# Patient Record
Sex: Male | Born: 1990 | Race: White | Hispanic: No | Marital: Married | Smoking: Never smoker
Health system: Southern US, Community
[De-identification: ages and names within clinical notes are randomized; demographics above are authoritative.]

## PROBLEM LIST (undated history)

## (undated) DIAGNOSIS — Q8500 Neurofibromatosis, unspecified: Secondary | ICD-10-CM

## (undated) DIAGNOSIS — K8050 Calculus of bile duct without cholangitis or cholecystitis without obstruction: Secondary | ICD-10-CM

## (undated) HISTORY — PX: HERNIA REPAIR: SHX51

---
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
## 2010-05-05 ENCOUNTER — Emergency Department: Payer: Self-pay | Admitting: Unknown Physician Specialty

## 2011-09-16 IMAGING — CT CT ABD-PELV W/O CM
1 of 2 series · 15 of 32 positions shown, 19 images · non-contrast
Comparison: none

REASON FOR EXAM: (1) right flank pain; (2) right abd flank pain
COMMENTS:   May transport without cardiac monitor

PROCEDURE:     CT  - CT ABDOMEN AND PELVIS W[DATE]  [DATE]
RESULT:     Comparison: None
TECHNIQUE: Multiple axial images from the lung bases to the symphysis pubis
were obtained without oral and without intravenous contrast.

[Series 2: stone · axial · 0.59mm/px · z∈[-1562,-1188]mm · 15 of 137 slices shown, 19 images]
[im 6/137  soft-tissue]
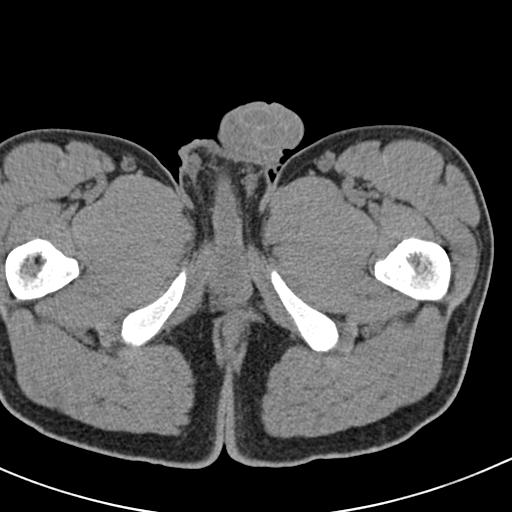
[im 6/137  bone]
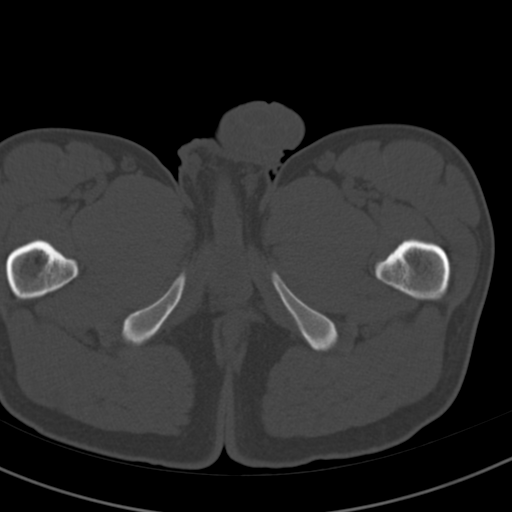
[im 16/137  soft-tissue]
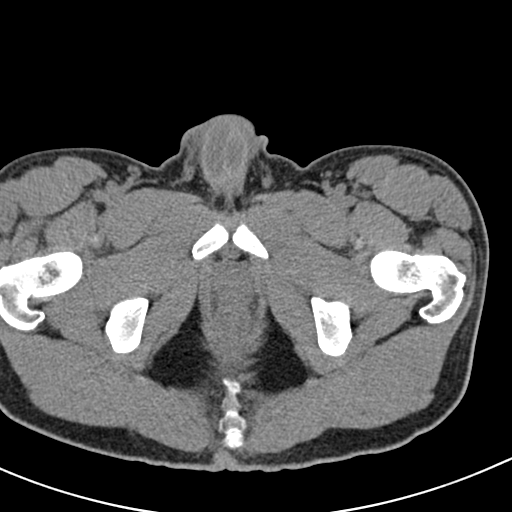
[im 27/137  soft-tissue]
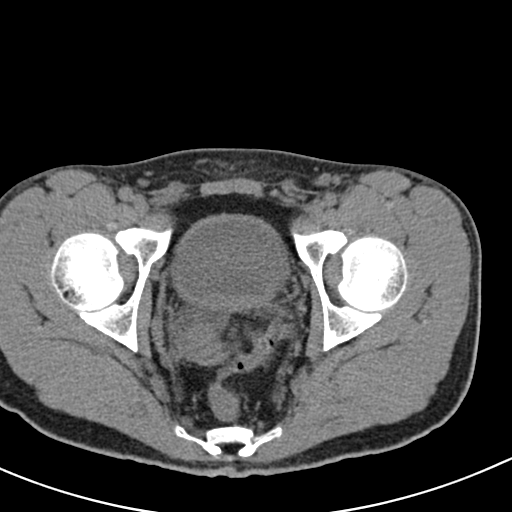
[im 37/137  soft-tissue]
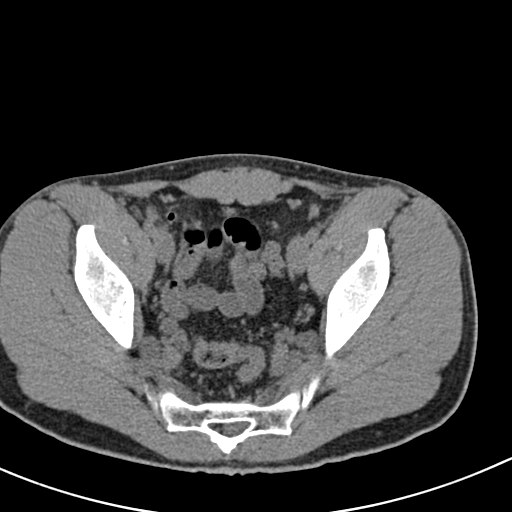
[im 48/137  soft-tissue]
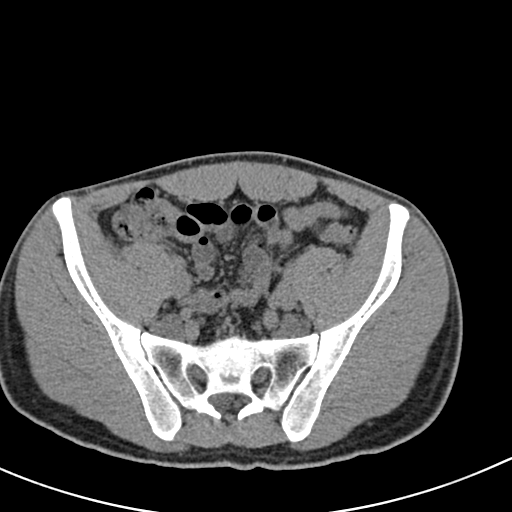
[im 58/137  soft-tissue]
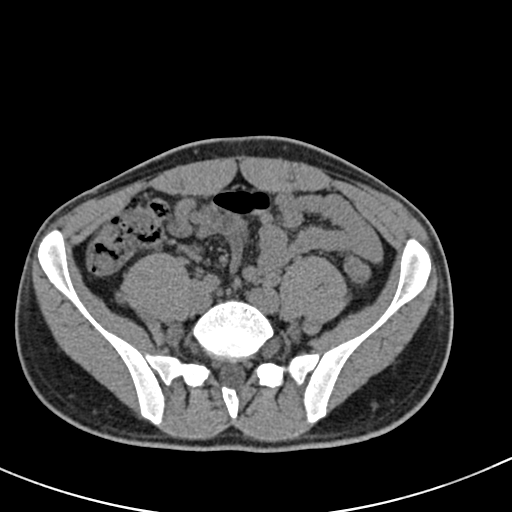
[im 69/137  soft-tissue]
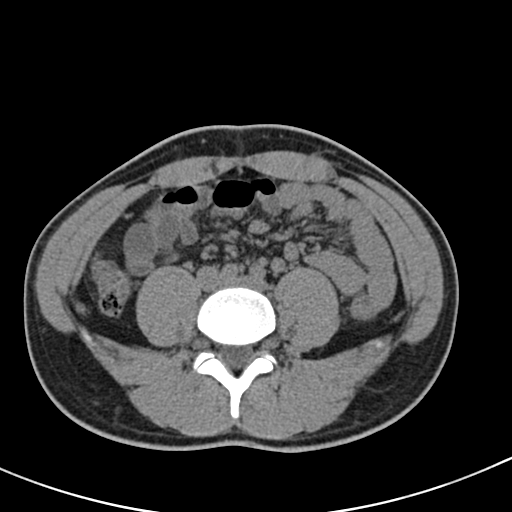
[im 79/137  soft-tissue]
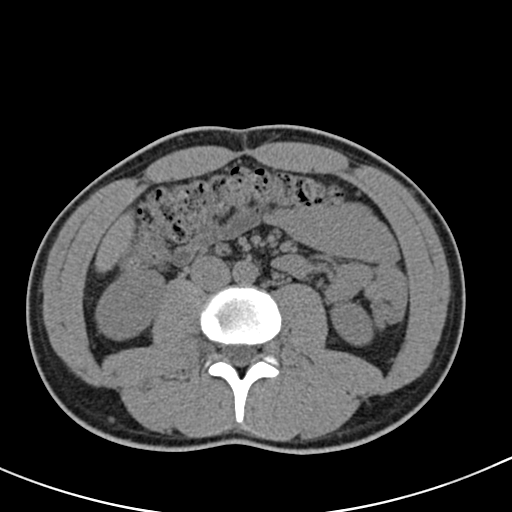
[im 89/137  soft-tissue]
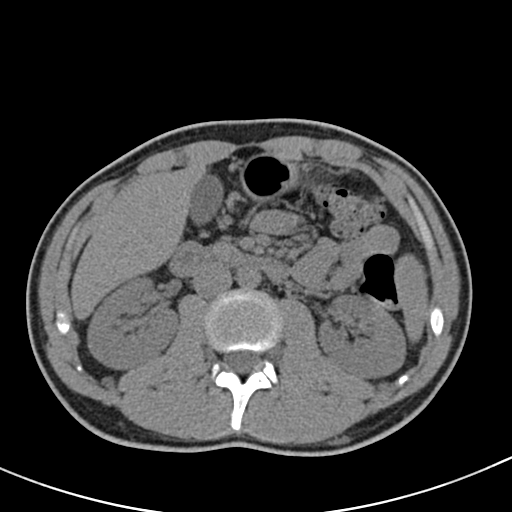
[im 89/137  bone]
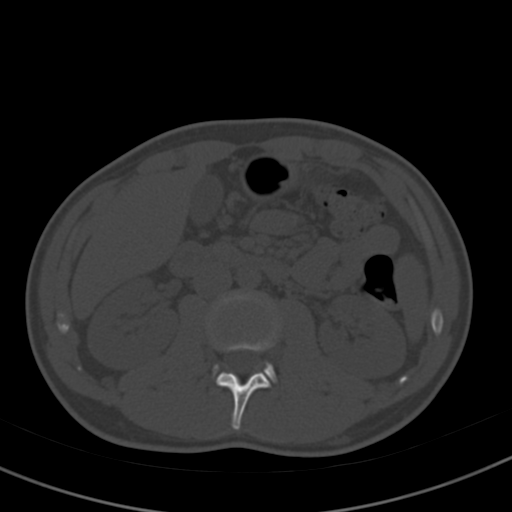
[im 100/137  soft-tissue]
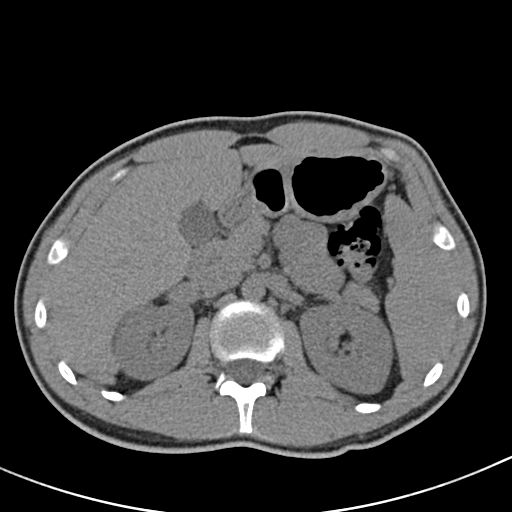
[im 110/137  soft-tissue]
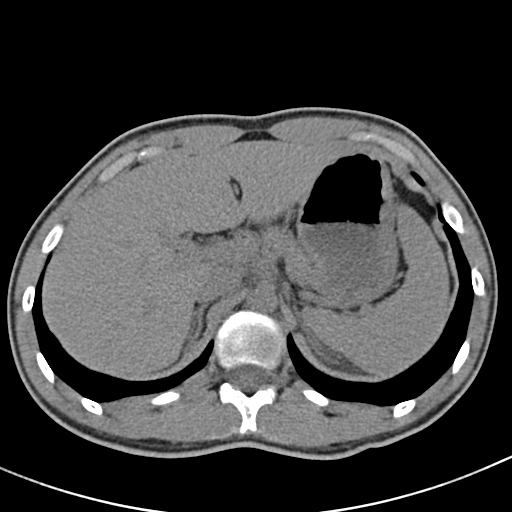
[im 116/137  lung]
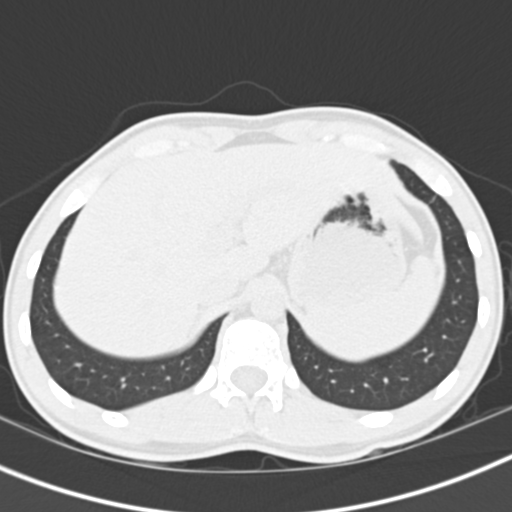
[im 121/137  soft-tissue]
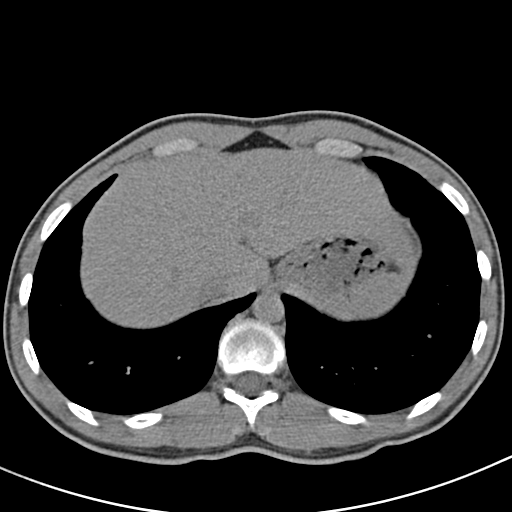
[im 121/137  lung]
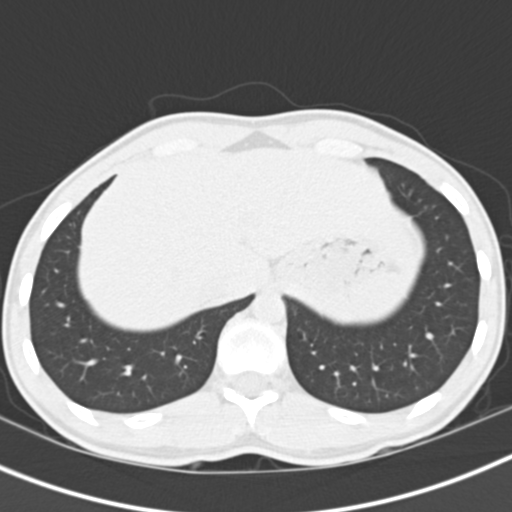
[im 126/137  lung]
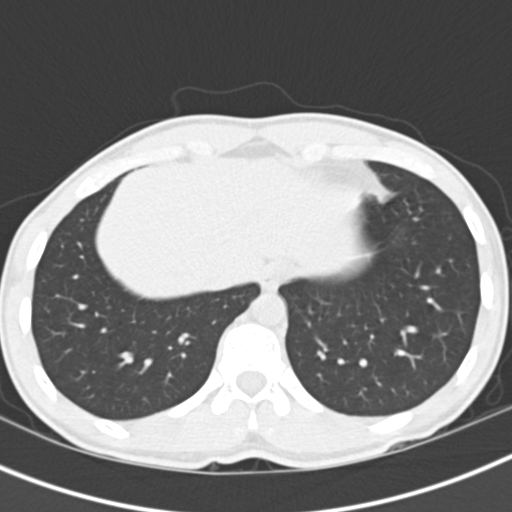
[im 131/137  soft-tissue]
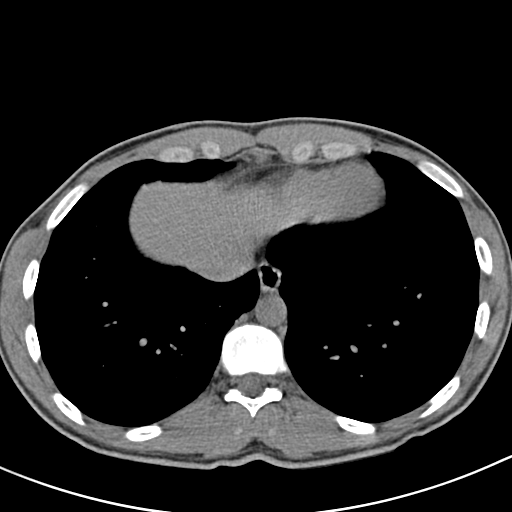
[im 131/137  lung]
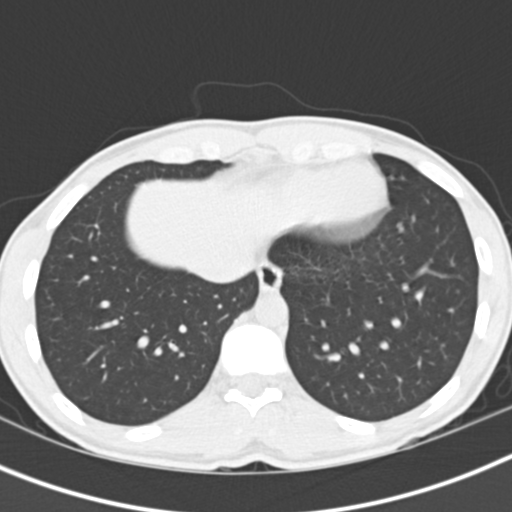

[15 of 32 positions shown; findings below may reference images not displayed]

FINDINGS: Lack of intravenous contrast limits evaluation of the solid abdominal
organs.  Grossly, the liver, gallbladder, spleen, adrenals, and pancreas are
unremarkable.

No hydronephrosis. No renal calculi identified. No perinephric stranding. No
ureterectasis.

The appendix is normal in size and air filled. There are mildly prominent
curvilinear densities in the region of the appendix which may represent
prominent vascularity. No free fluid in the pelvis.

No aggressive lytic or sclerotic osseous lesions identified.
IMPRESSION: 1. No urinary calculi identified. No hydronephrosis.
2. The appendix is normal in size and contains air. However, there are
mildly prominent linear densities in the anterior pelvis in the region of
the appendix. This is felt to be related to mildly prominent vessels in the
pelvis. However, correlate clinically for early changes of appendicitis.

## 2014-11-07 ENCOUNTER — Emergency Department
Admission: EM | Admit: 2014-11-07 | Discharge: 2014-11-07 | Disposition: A | Discharge status: Home / Self Care | Payer: Managed Care, Other (non HMO) | Attending: Student | Admitting: Student

## 2014-11-07 ENCOUNTER — Encounter: Payer: Self-pay | Admitting: Emergency Medicine

## 2014-11-07 DIAGNOSIS — L237 Allergic contact dermatitis due to plants, except food: Secondary | ICD-10-CM

## 2014-11-07 DIAGNOSIS — R21 Rash and other nonspecific skin eruption: Secondary | ICD-10-CM | POA: Diagnosis present

## 2014-11-07 HISTORY — DX: Neurofibromatosis, unspecified: Q85.00

## 2014-11-07 MED ORDER — HYDROXYZINE HCL 25 MG PO TABS
25.0000 mg | ORAL_TABLET | Freq: Once | ORAL | Status: AC
  Administered 2014-11-07: 25 mg via ORAL
  Filled 2014-11-07: qty 1

## 2014-11-07 MED ORDER — PREDNISONE 5 MG PO TABS: ORAL_TABLET | ORAL | Status: DC

## 2014-11-07 MED ORDER — METHYLPREDNISOLONE SODIUM SUCC 125 MG IJ SOLR
125.0000 mg | Freq: Once | INTRAMUSCULAR | Status: AC
  Administered 2014-11-07: 125 mg via INTRAMUSCULAR
  Filled 2014-11-07: qty 2

## 2014-11-07 MED ORDER — RANITIDINE HCL 150 MG PO TABS: 150.0000 mg | ORAL_TABLET | Freq: Two times a day (BID) | ORAL | Status: DC

## 2014-11-07 MED ORDER — FAMOTIDINE 20 MG PO TABS
20.0000 mg | ORAL_TABLET | Freq: Once | ORAL | Status: AC
  Administered 2014-11-07: 20 mg via ORAL
  Filled 2014-11-07: qty 1

## 2014-11-07 MED ORDER — HYDROXYZINE PAMOATE 25 MG PO CAPS: 25.0000 mg | ORAL_CAPSULE | Freq: Three times a day (TID) | ORAL | Status: DC | PRN

## 2014-11-07 NOTE — Discharge Instructions (Signed)

## 2014-11-07 NOTE — ED Notes (Signed)
Pt reports poison ivy rash to bilateral arms and hands, reports redness and swelling today. Pt states "it normally doesn't swell".

## 2014-11-07 NOTE — ED Provider Notes (Signed)
[facility] Emergency Department Provider Note  ____________________________________________  Time seen: Approximately 8:27 PM  I have reviewed the triage vital signs and the nursing notes.   HISTORY  Chief Complaint Rash    HPI Ronald Mueller is a 24 y.o. male has come in contact with poison ivy. Complains of bilateral arms and hands itching swelling and redness. States he probably came in contact with this at work. Denies any other complaints at this time.   Past Medical History  Diagnosis Date  . H/O neurofibromatosis     There are no active problems to display for this patient.   History reviewed. No pertinent past surgical history.  Current Outpatient Rx  Name  Route  Sig  Dispense  Refill  . hydrOXYzine (VISTARIL) 25 MG capsule   Oral   Take 1 capsule (25 mg total) by mouth 3 (three) times daily as needed.   30 capsule   0   . predniSONE (DELTASONE) 5 MG tablet      Take 6 tabs BID x 2 days, then 5 tabs BID x 2 days, then 4 tabs BIS x 2 days, then 3 tabs BID x 2 days, then 2 tabs BID x 2 days, then 1 tab BID x 2 days, then 1 tablet daily x 2 days.   86 tablet   0   . ranitidine (ZANTAC) 150 MG tablet   Oral   Take 1 tablet (150 mg total) by mouth 2 (two) times daily.   14 tablet   1     Allergies Review of patient's allergies indicates no known allergies.  No family history on file.  Social History Social History  Substance Use Topics  . Smoking status: Never Smoker   . Smokeless tobacco: None  . Alcohol Use: No    Review of Systems Constitutional: No fever/chills Eyes: No visual changes. ENT: No sore throat. Cardiovascular: Denies chest pain. Respiratory: Denies shortness of breath. Gastrointestinal: No abdominal pain.  No nausea, no vomiting.  No diarrhea.  No constipation. Genitourinary: Negative for dysuria. Musculoskeletal: Negative for back pain. Skin: Positive for skin rash Neurological: Negative for  headaches, focal weakness or numbness.  10-point ROS otherwise negative.  ____________________________________________   PHYSICAL EXAM:  VITAL SIGNS: ED Triage Vitals  Enc Vitals Group     BP 11/07/14 1753 126/75 mmHg     Pulse Rate 11/07/14 1753 69     Resp 11/07/14 1753 16     Temp 11/07/14 1753 98.4 F (36.9 C)     Temp Source 11/07/14 1753 Oral     SpO2 11/07/14 1753 97 %     Weight 11/07/14 1753 145 lb (65.772 kg)     Height 11/07/14 1753  (1.575 m)     Head Cir --      Peak Flow --      Pain Score 11/07/14 1754 0     Pain Loc --      Pain Edu? --      Excl. in GC? --     Constitutional: Alert and oriented. Well appearing and in no acute distress. Eyes: Conjunctivae are normal. PERRL. EOMI. Head: Atraumatic. Nose: No congestion/rhinnorhea. Mouth/Throat: Mucous membranes are moist.  Oropharynx non-erythematous. Neck: No stridor.   Cardiovascular: Normal rate, regular rhythm. Grossly normal heart sounds.  Good peripheral circulation. Respiratory: Normal respiratory effort.  No retractions. Lungs CTAB. Musculoskeletal: No lower extremity tenderness nor edema.  No joint effusions. Neurologic:  Normal speech and language. No gross  focal neurologic deficits are appreciated. No gait instability. Skin:  Skin is warm, dry and intact. Vesicular rash and erythema noted on bilateral upper extremities face and forehead. Psychiatric: Mood and affect are normal. Speech and behavior are normal.  ____________________________________________   LABS (all labs ordered are listed, but only abnormal results are displayed)  Labs Reviewed - No data to display ____________________________________________   PROCEDURES  Procedure(s) performed: None  Critical Care performed: No  ____________________________________________   INITIAL IMPRESSION / ASSESSMENT AND PLAN / ED COURSE  Pertinent labs & imaging results that were available during my care of the patient were reviewed  by me and considered in my medical decision making (see chart for details).  Contact dermatitis more specifically poison ivy. Rx given for prednisone taper Vistaril and Zantac for itching. Patient follow-up with PCP or return to the ER as needed. He voices no other emergency medical complaints at this time ____________________________________________   FINAL CLINICAL IMPRESSION(S) / ED DIAGNOSES  Final diagnoses:  Poison ivy dermatitis      Evangeline Dakin, PA-C 11/07/14 2029  Gayla Doss, MD 11/10/14 1401

## 2021-09-11 ENCOUNTER — Emergency Department
Admission: EM | Admit: 2021-09-11 | Discharge: 2021-09-11 | Disposition: A | Discharge status: Home / Self Care | Payer: Worker's Compensation | Attending: Emergency Medicine | Admitting: Emergency Medicine

## 2021-09-11 ENCOUNTER — Other Ambulatory Visit: Payer: Self-pay

## 2021-09-11 ENCOUNTER — Encounter: Payer: Self-pay | Admitting: Intensive Care

## 2021-09-11 DIAGNOSIS — W293XXA Contact with powered garden and outdoor hand tools and machinery, initial encounter: Secondary | ICD-10-CM | POA: Insufficient documentation

## 2021-09-11 DIAGNOSIS — S71112A Laceration without foreign body, left thigh, initial encounter: Secondary | ICD-10-CM | POA: Diagnosis not present

## 2021-09-11 DIAGNOSIS — Y990 Civilian activity done for income or pay: Secondary | ICD-10-CM | POA: Insufficient documentation

## 2021-09-11 DIAGNOSIS — S79922A Unspecified injury of left thigh, initial encounter: Secondary | ICD-10-CM | POA: Diagnosis present

## 2021-09-11 DIAGNOSIS — S81812A Laceration without foreign body, left lower leg, initial encounter: Secondary | ICD-10-CM

## 2021-09-11 MED ORDER — LIDOCAINE-EPINEPHRINE 2 %-1:100000 IJ SOLN
10.0000 mL | Freq: Once | INTRAMUSCULAR | Status: AC
  Administered 2021-09-11: 10 mL via INTRADERMAL

## 2021-09-11 NOTE — Discharge Instructions (Signed)
You were evaluated in the emergency department for a laceration. It was repaired with sutures. Keep the area clean and dry.  Wash multiple times per day with soap and water.  Do not go into the ocean or swimming pool.  Return to the emergency department or your primary care physician's office in 7-10 days for suture removal.  Return to the emergency department for:  -- Fever > 100.4F -- Increase pain in the wound -- Increase redness and swelling -- Pus coming from the wound -- Wound bleeds more than a small amount or it does not stop -- Wound edges come apart -- Severe pain -- Weakness or numbness in the affected area  Or any other new or worsening symptoms. It was a pleasure caring for you.  

## 2021-09-11 NOTE — ED Triage Notes (Signed)
Patient presents with left leg laceration. Incident at work.   [employer]  Employer requests urine test. Ronald Mueller [phone]

## 2021-09-11 NOTE — ED Provider Notes (Signed)
[facility] Provider Note    Event Date/Time   First MD Initiated Contact with Patient 09/11/21 1225     (approximate)   History   Extremity Laceration (Left leg)   HPI  Ronald Mueller is a 31 y.o. male who presents today for evaluation of leg laceration.  Patient reports that he was working with a chainsaw when he slipped and accidentally cut his leg.  He denies numbness or tingling.  He is able to ambulate.  He reports that his tetanus is up-to-date.  Denies any other injury.     Physical Exam   Triage Vital Signs: ED Triage Vitals  Enc Vitals Group     BP 09/11/21 1125 124/68     Pulse Rate 09/11/21 1125 95     Resp 09/11/21 1125 18     Temp 09/11/21 1125 98.5 F (36.9 C)     Temp Source 09/11/21 1125 Oral     SpO2 09/11/21 1125 96 %     Weight 09/11/21 1117 160 lb (72.6 kg)     Height 09/11/21 1117 5\' 2"  (1.575 m)     Head Circumference --      Peak Flow --      Pain Score 09/11/21 1117 3     Pain Loc --      Pain Edu? --      Excl. in GC? --     Most recent vital signs: Vitals:   09/11/21 1125  BP: 124/68  Pulse: 95  Resp: 18  Temp: 98.5 F (36.9 C)  SpO2: 96%    Physical Exam Vitals and nursing note reviewed.  Constitutional:      General: Awake and alert. No acute distress.    Appearance: Normal appearance. He is well-developed and normal weight.  HENT:     Head: Normocephalic and atraumatic.     Mouth/Throat:     Mouth: Mucous membranes are moist.  Eyes:     General: PERRL. Normal EOMs        Right eye: No discharge.        Left eye: No discharge.     Conjunctiva/sclera: Conjunctivae normal.  Cardiovascular:     Rate and Rhythm: Normal rate and regular rhythm.     Pulses: Normal pulses.  Pulmonary:     Effort: Pulmonary effort is normal. No respiratory distress.  Abdominal:     Abdomen is soft. There is no abdominal tenderness. No rebound or guarding. No distention. Musculoskeletal:        General: No  swelling. Normal range of motion.     Cervical back: Normal range of motion and neck supple.  Skin:    General: Skin is warm and dry.     Capillary Refill: Capillary refill takes less than 2 seconds.     Findings: No rash.  Left lower extremity: 2.5cm laceration to lateral thigh. Approximately 2-3mm deep, base of wound visualized. Approximately 3 inches above knee, does not violate joint capsule. No FB. Normal and full ROM of knee and hip with AROM and PROM. Distal sensation intact. Normal pedal pulses. Normal capillary refill Neurological:     Mental Status: He is alert.      ED Results / Procedures / Treatments   Labs (all labs ordered are listed, but only abnormal results are displayed) Labs Reviewed - No data to display   EKG     RADIOLOGY     PROCEDURES:  Critical Care performed:   1mMarland KitchenLaceration Repair  Date/Time: 09/11/2021 4:18 PM  Performed by: Jackelyn Hoehn, PA-C Authorized by: Jackelyn Hoehn, PA-C   Consent:    Consent obtained:  Verbal   Consent given by:  Patient   Risks, benefits, and alternatives were discussed: yes     Risks discussed:  Infection, need for additional repair, nerve damage, pain, poor cosmetic result, poor wound healing, retained foreign body, tendon damage and vascular damage   Alternatives discussed:  No treatment Universal protocol:    Procedure explained and questions answered to patient or proxy's satisfaction: yes     Relevant documents present and verified: yes     Test results available: yes     Required blood products, implants, devices, and special equipment available: yes     Site/side marked: yes     Immediately prior to procedure, a time out was called: yes     Patient identity confirmed:  Verbally with patient Anesthesia:    Anesthesia method:  Local infiltration   Local anesthetic:  Lidocaine 1% WITH epi Laceration details:    Location:  Leg   Leg location:  L upper leg   Length (cm):  2.5   Depth (mm):   3 Pre-procedure details:    Preparation:  Patient was prepped and draped in usual sterile fashion Exploration:    Hemostasis achieved with:  Direct pressure   Wound exploration: wound explored through full range of motion and entire depth of wound visualized     Wound extent: no foreign bodies/material noted, no muscle damage noted, no nerve damage noted, no tendon damage noted, no underlying fracture noted and no vascular damage noted   Treatment:    Area cleansed with:  Saline   Amount of cleaning:  Extensive   Irrigation solution:  Sterile saline   Irrigation volume:  1L   Irrigation method:  Pressure wash Skin repair:    Repair method:  Sutures   Number of sutures:  4 Approximation:    Approximation:  Close Repair type:    Repair type:  Simple Post-procedure details:    Dressing:  Open (no dressing)   Procedure completion:  Tolerated well, no immediate complications    MEDICATIONS ORDERED IN ED: Medications  lidocaine-EPINEPHrine (XYLOCAINE W/EPI) 2 %-1:100000 (with pres) injection 10 mL (10 mLs Intradermal Given 09/11/21 1318)     IMPRESSION / MDM / ASSESSMENT AND PLAN / ED COURSE  I reviewed the triage vital signs and the nursing notes.   Differential diagnosis includes, but is not limited to, laceratioon.  Wound fully probed and base of wound visualized, no retained foreign body.  Wound is somewhat superficial despite mechanism of injury, several inches above the knee, does not violate the joint capsule.  Wound is hemostatic upon arrival.he has full normal range of motion of his hip and knee.  Normal distal pulses, normal sensation.  Wound was anesthetized and irrigated.  It was closed with sutures.  Patient was educated on suture care and timeline for removal.  We discussed return precautions.  Tdap already up-to-date.  Patient understands and agrees with plan.  Discharged in stable condition.   Patient's presentation is most consistent with acute, uncomplicated  illness.     FINAL CLINICAL IMPRESSION(S) / ED DIAGNOSES   Final diagnoses:  Laceration of left lower extremity, initial encounter     Rx / DC Orders   ED Discharge Orders     None        Note:  This document was prepared using Dragon voice recognition  software and may include unintentional dictation errors.   Keturah Shavers 09/11/21 1619    Jene Every, MD 09/12/21 [phone]

## 2021-09-18 DIAGNOSIS — Q8500 Neurofibromatosis, unspecified: Secondary | ICD-10-CM | POA: Insufficient documentation

## 2022-02-05 NOTE — Patient Instructions (Incomplete)

## 2022-02-08 ENCOUNTER — Ambulatory Visit: Payer: Self-pay | Admitting: Urology

## 2022-02-08 ENCOUNTER — Encounter: Payer: Self-pay | Admitting: Urology

## 2022-02-26 NOTE — Patient Instructions (Incomplete)

## 2022-02-27 ENCOUNTER — Encounter: Payer: Self-pay | Admitting: Urology

## 2022-02-27 ENCOUNTER — Ambulatory Visit (INDEPENDENT_AMBULATORY_CARE_PROVIDER_SITE_OTHER): Payer: 59 | Admitting: Urology

## 2022-02-27 VITALS — BP 114/74 | HR 76 | Ht 63.0 in | Wt 160.0 lb

## 2022-02-27 DIAGNOSIS — Z3009 Encounter for other general counseling and advice on contraception: Secondary | ICD-10-CM | POA: Diagnosis not present

## 2022-02-28 MED ORDER — DIAZEPAM 10 MG PO TABS: 10.0000 mg | ORAL_TABLET | Freq: Once | ORAL | 0 refills | Status: AC

## 2022-02-28 NOTE — Progress Notes (Signed)
02/27/2022 8:07 AM   Ronald Mueller 1990-07-04 [id number]  Referring provider: Carren Rang, PA-C [address] [place],  [place] [address]  Chief Complaint  Patient presents with   VAS Consult    HPI: 31 y.o. year old male referred for further evaluation of possible vasectomy.  He denies a history of testicular trauma or pain.  No urinary issues.  No previous scrotal surgeries.  He has 2 biological children.  He and is wife desire no further pregnancies.     PMH: Past Medical History:  Diagnosis Date   H/O neurofibromatosis [facility])     Surgical History: No past surgical history on file.  Home Medications:  Allergies as of 02/27/2022   No Known Allergies      Medication List        Accurate as of February 27, 2022 11:59 PM. If you have any questions, ask your nurse or doctor.          STOP taking these medications    hydrOXYzine 25 MG capsule Commonly known as: VISTARIL Stopped by: Vanna Scotland, MD   predniSONE 5 MG tablet Commonly known as: DELTASONE Stopped by: Vanna Scotland, MD   ranitidine 150 MG tablet Commonly known as: Zantac Stopped by: Vanna Scotland, MD        Allergies: No Known Allergies  Family History: No family history on file.  Social History:  reports that he has never smoked. He has never been exposed to tobacco smoke. He has never used smokeless tobacco. He reports that he does not drink alcohol and does not use drugs.   Physical Exam: BP 114/74   Pulse 76   Ht 5\' 3"  (1.6 m)   Wt 160 lb (72.6 kg)   BMI 28.34 kg/m   Constitutional:  Alert and oriented, No acute distress. HEENT: [place] AT, moist mucus membranes.  Trachea midline, no masses. Cardiovascular: No clubbing, cyanosis, or edema. Respiratory: Normal respiratory effort, no increased work of breathing. GI: Abdomen is soft, nontender, nondistended, no abdominal masses GU: Normal phallus.  Bilateral descended testicles without masses.   Vasa easily palpable bilaterally. Skin: No rashes, bruises or suspicious lesions. Neurologic: Grossly intact, no focal deficits, moving all 4 extremities. Psychiatric: Normal mood and affect.   Assessment & Plan:    1. Vasectomy evaluation Today, we discussed what the vas deferens is, where it is located, and its function. We reviewed the procedure for vasectomy, it's risks, benefits, alternatives, and likelihood of achieving his goals. We discussed in detail the procedure, complications, and recovery as well as the need for clearance prior to unprotected intercourse. We discussed that vasectomy does not protect against sexually transmitted diseases. We discussed that this procedure does not result in immediate sterility and that they would need to use other forms of birth control until he has been cleared with negative postvasectomy semen analyses. I explained that the procedure is considered to be permanent and that attempts at reversal have varying degrees of success. These options include vasectomy reversal, sperm retrieval, and in vitro fertilization; these can be very expensive. We discussed the chance of postvasectomy pain syndrome which occurs in less than 5% of patients. I explained to the patient that there is no treatment to resolve this chronic pain, and that if it developed I would not be able to help resolve the issue, but that surgery is generally not needed for correction. I explained there have even been reports of systemic like illness associated with this chronic pain,  and that there was no good cure. I explained that vasectomy it is not a 100% reliable form of birth control, and the risk of pregnancy after vasectomy is approximately 1 in 2000 men who had a negative postvasectomy semen analysis or rare non-motile sperm. I explained that repeat vasectomy was necessary in less than 1% of vasectomy procedures when employing the type of technique that I use. I explained that he should refrain  from ejaculation for approximately one week following vasectomy. I explained that there are other options for birth control which are permanent and non-permanent; we discussed these. I explained the rates of surgical complications, such as symptomatic hematoma or infection, are low (1-2%) and vary with the surgeon's experience and criteria used to diagnose the complication.   The patient had the opportunity to ask questions to his stated satisfaction. He voiced understanding of the above factors and stated that he has read all the information provided to him and the packets and informed consent.  He is interested in receiving of Valium 10 mg prior to the procedure for the purpose of anxiolysis.  A prescription was given today.  He will have a driver on the day of the procedure.    Vanna Scotland, MD  [facility] [address] [place], [place] [address] [phone]

## 2022-03-05 ENCOUNTER — Encounter: Payer: Self-pay | Admitting: Urology

## 2022-04-04 ENCOUNTER — Ambulatory Visit (INDEPENDENT_AMBULATORY_CARE_PROVIDER_SITE_OTHER): Payer: Self-pay | Admitting: Urology

## 2022-04-04 DIAGNOSIS — Z9852 Vasectomy status: Secondary | ICD-10-CM

## 2022-04-04 DIAGNOSIS — Z302 Encounter for sterilization: Secondary | ICD-10-CM

## 2022-04-04 NOTE — Patient Instructions (Signed)

## 2022-04-04 NOTE — Progress Notes (Signed)
04/04/22  CC:  Chief Complaint  Patient presents with   Sterilization    HPI: 32 year old male who presents today for vasectomy.  Consent was confirmed.  NED. A&Ox3.   No respiratory distress   Abd soft, NT, ND Normal external genitalia with patent urethral meatus  A timeout was performed.  Patient's identity and consent was confirmed.  All questions were answered.   Bilateral Vasectomy Procedure  Pre-Procedure: - Patient's scrotum was prepped and draped for vasectomy. - The vas was palpated through the scrotal skin on the left. - 1% Xylocaine was injected into the skin and surrounding tissue for placement  - In a similar manner, the vas on the right was identified, anesthetized, and stabilized.  Procedure: - A #11 blade was used to make a small stab incision in the skin overlying the vas - The left vas was isolated and brought up through the incision exposing that structure. - Bleeding points were cauterized as they occurred. - The vas was free from the surrounding structures and brought to the view. - A segment was positioned for placement with a hemostat. - A second hemostat was placed and a small segment between the two hemostats and was removed for inspection. - Each end of the transected vas lumen was fulgurated/ obliterated using needlepoint electrocautery -A fascial interposition was performed on testicular end of the vas using #3-0 chromic suture -The same procedure was performed on the right. - A single suture of #3-0 chromic catgut was used to close each lateral scrotal skin incision - A dressing was applied.  Post-Procedure: - Patient was instructed in care of the operative area - A specimen is to be delivered in 12 weeks   -Another form of contraception is to be used until post vasectomy semen analysis  Hollice Espy, MD

## 2022-07-02 ENCOUNTER — Encounter: Payer: Self-pay | Admitting: Urology

## 2022-07-03 NOTE — Addendum Note (Signed)
Addended by: Consuella Lose on: 07/03/2022 08:21 AM   Modules accepted: Orders

## 2022-07-04 ENCOUNTER — Other Ambulatory Visit: Payer: Medicaid Other

## 2022-07-04 DIAGNOSIS — Z9852 Vasectomy status: Secondary | ICD-10-CM

## 2022-07-05 LAB — POST-VAS SPERM EVALUATION,QUAL

## 2022-07-26 ENCOUNTER — Other Ambulatory Visit: Payer: Medicaid Other

## 2022-07-26 ENCOUNTER — Other Ambulatory Visit: Payer: Self-pay

## 2022-07-26 DIAGNOSIS — Z9852 Vasectomy status: Secondary | ICD-10-CM

## 2022-07-27 LAB — POST-VAS SPERM EVALUATION,QUAL: Volume: 1.7 mL

## 2022-08-01 ENCOUNTER — Emergency Department
Admission: EM | Admit: 2022-08-01 | Discharge: 2022-08-01 | Disposition: A | Discharge status: Home / Self Care | Payer: Medicaid Other | Attending: Emergency Medicine | Admitting: Emergency Medicine

## 2022-08-01 ENCOUNTER — Encounter: Payer: Self-pay | Admitting: Urology

## 2022-08-01 ENCOUNTER — Emergency Department: Payer: Medicaid Other

## 2022-08-01 ENCOUNTER — Telehealth: Payer: Self-pay | Admitting: Family Medicine

## 2022-08-01 ENCOUNTER — Other Ambulatory Visit: Payer: Self-pay

## 2022-08-01 DIAGNOSIS — N50812 Left testicular pain: Secondary | ICD-10-CM

## 2022-08-01 DIAGNOSIS — K4090 Unilateral inguinal hernia, without obstruction or gangrene, not specified as recurrent: Secondary | ICD-10-CM | POA: Diagnosis not present

## 2022-08-01 LAB — URINALYSIS, ROUTINE W REFLEX MICROSCOPIC
Bilirubin Urine: NEGATIVE
Glucose, UA: NEGATIVE mg/dL
Hgb urine dipstick: NEGATIVE
Ketones, ur: NEGATIVE mg/dL
Leukocytes,Ua: NEGATIVE
Nitrite: NEGATIVE
Protein, ur: NEGATIVE mg/dL
Specific Gravity, Urine: 1.026 (ref 1.005–1.030)
pH: 6 (ref 5.0–8.0)

## 2022-08-01 NOTE — ED Triage Notes (Signed)
Pt presents to ED with L sided testicle pain, pt states vesicotomy in Jan. Pt states pain started 1 hour ago. Pt in NAD noted. Pt states pain radiates to groin lower ABD area.

## 2022-08-01 NOTE — Telephone Encounter (Signed)
Patient called and states he was seen in the ER and they said he has an Inguinal hernia and testicle pain. They were wanting him to schedule an appointment with our office. Do you him to be scheduled here or contact PCP for referral for inguinal hernia first?

## 2022-08-01 NOTE — Discharge Instructions (Signed)
Your testicular ultrasound shows a normal testicle, however you appears that you have a small inguinal hernia that is fat-containing.  Please wear supportive underpants.  You may take ibuprofen per package instructions to help with your pain.  Please follow-up with urology.  Please refrain from any heavy lifting.  It was a pleasure caring for you today.

## 2022-08-01 NOTE — ED Notes (Signed)
Patient transported to Ultrasound 

## 2022-08-01 NOTE — Telephone Encounter (Signed)
Left detailed message letting patient know that he needs to contact PCP for general surgeon referral and that if he is having testicular pain/issues and he would like to see [place] for that he can set up appointment with a PA but ER did the work up for this. Sent mychart message with this information also.

## 2022-08-01 NOTE — ED Provider Notes (Signed)
[facility] Provider Note    Event Date/Time   First MD Initiated Contact with Patient 08/01/22 1152     (approximate)   History   Testicle Pain   HPI  Ronald Mueller is a 32 y.o. male presents today for evaluation of left testicular pain.  Patient reports that he noticed a tugging in his left testicle prior to arrival.  He denies abdominal pain, nausea, vomiting, diarrhea.  He reports that he is sexually active with 1 male partner only.  He has not had any burning with urination.  He has not had any penile discharge.  He has not concerned about sexually transmitted diseases.  No fevers or chills.  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      Excl. in GC?     Most recent vital signs: Vitals:   08/01/22 1135  BP: (!) 130/50  Pulse: 93  Resp: 18  Temp: 98.5 F (36.9 C)  SpO2: 99%    Physical Exam Vitals and nursing note reviewed.  Constitutional:      General: Awake and alert. No acute distress.    Appearance: Normal appearance. The patient is normal weight.  HENT:     Head: Normocephalic and atraumatic.     Mouth: Mucous membranes are moist.  Eyes:     General: PERRL. Normal EOMs        Right eye: No discharge.        Left eye: No discharge.     Conjunctiva/sclera: Conjunctivae normal.  Cardiovascular:     Rate and Rhythm: Normal rate and regular rhythm.     Pulses: Normal pulses.  Pulmonary:     Effort: Pulmonary effort is normal. No respiratory distress.     Breath sounds: Normal breath sounds.  Abdominal:     Abdomen is soft. There is no abdominal  tenderness. No rebound or guarding. No distention. GU: left testicular tenderness, though no skin changes, no swelling. Normal testicular lay, normal cremasteric reflex Musculoskeletal:        General: No swelling. Normal range of motion.     Cervical back: Normal range of motion and neck supple.  Skin:    General: Skin is warm and dry.     Capillary Refill: Capillary refill takes less than 2 seconds.     Findings: No rash.  Neurological:     Mental Status: The patient is awake and alert.      ED Results / Procedures / Treatments   Labs (all labs ordered are listed, but only abnormal results are displayed) Labs Reviewed  URINALYSIS, ROUTINE W REFLEX MICROSCOPIC - Abnormal; Notable for the following components:      Result Value   Color, Urine YELLOW (*)    APPearance HAZY (*)    All  other components within normal limits  CHLAMYDIA/NGC RT PCR [facility] ONLY)               EKG     RADIOLOGY I independently reviewed and interpreted imaging and agree with radiologists findings.     PROCEDURES:  Critical Care performed:   Procedures   MEDICATIONS ORDERED IN ED: Medications - No data to display   IMPRESSION / MDM / ASSESSMENT AND PLAN / ED COURSE  I reviewed the triage vital signs and the nursing notes.   Differential diagnosis includes, but is not limited to, epididymitis, orchitis, testicular torsion, hernia, varicocele, hydrocele.  Patient is awake and alert, hemodynamically stable and afebrile.  He has a normal-appearing testicular exam, normal testicular lie, no swelling, erythema, or skin changes.  No discharge.  Ultrasound obtained is negative for any acute findings related to the testicles, however he does have a fat-containing left inguinal hernia which may be contributing to his symptoms.  Urinalysis does not demonstrate infection.  I recommended outpatient follow-up with urology and he was also given the information for general surgery.  We discussed the  importance of scrotal support and strict return precautions.  Patient understands and agrees with plan.  He was discharged in stable condition.   Patient's presentation is most consistent with acute complicated illness / injury requiring diagnostic workup.  FINAL CLINICAL IMPRESSION(S) / ED DIAGNOSES   Final diagnoses:  Left testicular pain  Inguinal hernia, left     Rx / DC Orders   ED Discharge Orders     None        Note:  This document was prepared using Dragon voice recognition software and may include unintentional dictation errors.   Keturah Shavers 08/01/22 1328    Chesley Noon, MD 08/01/22 1925

## 2022-08-02 LAB — CHLAMYDIA/NGC RT PCR (ARMC ONLY)
Chlamydia Tr: NOT DETECTED
N gonorrhoeae: NOT DETECTED

## 2022-08-08 ENCOUNTER — Encounter: Payer: Self-pay | Admitting: Surgery

## 2022-08-08 ENCOUNTER — Ambulatory Visit: Payer: Medicaid Other | Admitting: Surgery

## 2022-08-08 VITALS — BP 131/74 | HR 75 | Temp 98.2°F | Ht 62.0 in | Wt 158.4 lb

## 2022-08-08 DIAGNOSIS — K4090 Unilateral inguinal hernia, without obstruction or gangrene, not specified as recurrent: Secondary | ICD-10-CM | POA: Diagnosis not present

## 2022-08-08 NOTE — Patient Instructions (Signed)
Our surgery scheduler Barbara will call you within 24-48 hours to get you scheduled. If you have not heard from her after 48 hours, please call our office. Have the blue sheet available when she calls to write down important information.   If you have any concerns or questions, please feel free to call our office.   Laparoscopic Inguinal Hernia Repair, Adult Laparoscopic inguinal hernia repair is a surgical procedure to repair a small, weak spot in the groin muscles that allows fat or intestines from inside the abdomen to bulge out (inguinal hernia). This procedure may be planned, or it may be an emergency procedure. During the procedure, tissue that has bulged out is moved back into place, and the opening in the groin muscles is repaired. This is done through three small incisions in the abdomen. A thin tube with a light and camera on the end (laparoscope) is used to help perform the procedure. Tell a health care provider about: Any allergies you have. All medicines you are taking, including vitamins, herbs, eye drops, creams, and over-the-counter medicines. Any problems you or family members have had with anesthetic medicines. Any blood disorders you have. Any surgeries you have had. Any medical conditions you have. Whether you are pregnant or may be pregnant. What are the risks? Generally, this is a safe procedure. However, problems may occur, including: Infection. Bleeding. Allergic reactions to medicines. Damage to nearby structures or organs. Testicle damage or long-term pain and swelling of the scrotum, in males. Inability to completely empty the bladder (urinary retention). Blood clots. A collection of fluid that builds up under the skin (seroma). The hernia coming back (recurrence). What happens before the procedure? Staying hydrated Follow instructions from your health care provider about hydration, which may include: Up to 2 hours before the procedure - you may continue to  drink clear liquids, such as water, clear fruit juice, black coffee, and plain tea.  Eating and drinking restrictions Follow instructions from your health care provider about eating and drinking, which may include: 8 hours before the procedure - stop eating heavy meals or foods, such as meat, fried foods, or fatty foods. 6 hours before the procedure - stop eating light meals or foods, such as toast or cereal. 6 hours before the procedure - stop drinking milk or drinks that contain milk. 2 hours before the procedure - stop drinking clear liquids. Medicines Ask your health care provider about: Changing or stopping your regular medicines. This is especially important if you are taking diabetes medicines or blood thinners. Taking medicines such as aspirin and ibuprofen. These medicines can thin your blood. Do not take these medicines unless your health care provider tells you to take them. Taking over-the-counter medicines, vitamins, herbs, and supplements. General instructions Do not use any products that contain nicotine or tobacco for at least 4 weeks before the procedure, if possible. These products include cigarettes, chewing tobacco, and vaping devices, such as e-cigarettes. If you need help quitting, ask your health care provider. Ask your health care provider: How your surgery site will be marked. What steps will be taken to help prevent infection. These steps may include: Removing hair at the surgery site. Washing skin with a germ-killing soap. Taking antibiotic medicine. Plan to have a responsible adult take you home from the hospital or clinic. Plan to have a responsible adult care for you for the time you are told after you leave the hospital or clinic. This is important. What happens during the procedure? An IV will   be inserted into one of your veins. You will be given one or more of the following: A medicine to help you relax (sedative). A medicine to make you fall asleep  (general anesthetic). Three small incisions will be made in your abdomen. Your abdomen will be inflated with carbon dioxide gas to make the surgical area easier to see. A laparoscope and surgical instruments will be inserted through the incisions. The laparoscope will send images of the inside of your abdomen to a monitor in the room. Tissue that is bulging through the hernia may be removed or moved back into place. The hernia opening will be closed with a sheet of surgical mesh. The surgical instruments and laparoscope will be removed. Your incisions will be closed with stitches (sutures) and adhesive strips. A bandage (dressing) will be placed over your incisions. The procedure may vary among health care providers and hospitals. What happens after the procedure? Your blood pressure, heart rate, breathing rate, and blood oxygen level will be monitored until you leave the hospital or clinic. You will be given pain medicine as needed. You may continue to receive medicines and fluids through an IV. The IV will be removed after you can drink fluids. You will be encouraged to get up and move around and to take deep breaths frequently. If you were given a sedative during the procedure, it can affect you for several hours. Do not drive or operate machinery until your health care provider says that it is safe. Summary Laparoscopic inguinal hernia repair is a surgical procedure to repair a small, weak spot in the groin muscles that allows fat or intestines from inside the abdomen to bulge out (inguinal hernia). This procedure is done through three small incisions in the abdomen. A thin tube with a light and camera on the end (laparoscope) is used to help perform the procedure. After the procedure, you will be encouraged to get up and move around and to take deep breaths frequently. This information is not intended to replace advice given to you by your health care provider. Make sure you discuss any  questions you have with your health care provider. Document Revised: 11/10/2019 Document Reviewed: 11/10/2019 Elsevier Patient Education  2023 [employer] Inc.  

## 2022-08-09 ENCOUNTER — Telehealth: Payer: Self-pay | Admitting: Surgery

## 2022-08-09 NOTE — Telephone Encounter (Signed)
Patient has been advised of Pre-Admission date/time, and Surgery date at [facility].  Surgery Date: 08/23/22 Preadmission Testing Date: 08/15/22 (phone 8a-1p)  Patient has been made aware to call [phone], between 1-3:00pm the day before surgery, to find out what time to arrive for surgery.

## 2022-08-11 NOTE — Progress Notes (Signed)
Patient ID: Ronald Mueller, male   DOB: [birth year], 32 y.o.   MRN: [id number]  HPI Ronald Mueller is a 32 y.o. male seen at the request of Mrs. Poggi PAC  .  He endorses left inguinal pain that radiates to the left testicle.  Seems to be worsening with significant physical efforts and Valsalva maneuvers.  The pain is intermittent sharp and moderate in intensity.  He endorses that the pain has been present on and off for the last couple weeks.  There is no specific alleviating or aggravating factors.  He did have a visit recently to the emergency room and at that time an ultrasound was performed that I have personally reviewed showing no evidence of testicular torsion no evidence of testicular ischemia but evidence of small inguinal hernia on the left side. Patient is able to perform more than 6 METS of activity without any shortness of breath or chest pain.  He does have history of  neurofibromatosis.  No prior inguinal or abdominal operations His work seems to be quite physical.  HPI  Past Medical History:  Diagnosis Date   H/O neurofibromatosis (HCC)     History reviewed. No pertinent surgical history.  History reviewed. No pertinent family history.  Social History Social History   Tobacco Use   Smoking status: Never    Passive exposure: Never   Smokeless tobacco: Never  Substance Use Topics   Alcohol use: No   Drug use: No    No Known Allergies  Current Outpatient Medications  Medication Sig Dispense Refill   ibuprofen (ADVIL) 600 MG tablet Take by mouth.     No current facility-administered medications for this visit.     Review of Systems Full ROS  was asked and was negative except for the information on the HPI  Physical Exam Blood pressure 131/74, pulse 75, temperature 98.2 F (36.8 C), temperature source Oral, height 5\' 2"  (1.575 m), weight 158 lb 6.4 oz (71.8 kg), SpO2 99 %. CONSTITUTIONAL: NAD. EYES: Pupils are equal, round, and reactive to light, Sclera are  non-icteric. EARS, NOSE, MOUTH AND THROAT: The oropharynx is clear. The oral mucosa is pink and moist. Hearing is intact to voice. LYMPH NODES:  Lymph nodes in the neck are normal. RESPIRATORY:  Lungs are clear. There is normal respiratory effort, with equal breath sounds bilaterally, and without pathologic use of accessory muscles. CARDIOVASCULAR: Heart is regular without murmurs, gallops, or rubs. GI: The abdomen is  soft, nontender, and nondistended. There are no palpable masses. There is no hepatosplenomegaly. There are normal bowel sounds .  There is evidence of mild left inguinal defect that is reducible but tender GU: There is some evidence of testicular tenderness on the left side. MUSCULOSKELETAL: Normal muscle strength and tone. No cyanosis or edema.   SKIN: Turgor is good and there are no pathologic skin lesions or ulcers. NEUROLOGIC: Motor and sensation is grossly normal. Cranial nerves are grossly intact. PSYCH:  Oriented to person, place and time. Affect is normal.  Data Reviewed I have personally reviewed the patient's imaging, laboratory findings and medical records.    Assessment/Plan  32 year old male with a symptomatic left inguinal hernia.  Discussed with the patient in detail about his disease process.  Given his symptoms I do recommend repair.  Discussion about different approaches to include open versus robotic versus laparoscopically.  I do prefer to have robotic approach in the hands.  Also discussed with him the possibility of repairing bilateral defects if in fact  we find a second defect.  Procedure discussed with patient in detail.  Risk, benefits and possible complications including but not limited to, bleeding, infection, nerve injury, bowel injury, chronic pain specifically the fact that he has preoperative inguinodynia is the most important factor for chronic pain.   Sterling Big, MD FACS General Surgeon 08/11/2022, 1:04 PM

## 2022-08-11 NOTE — H&P (View-Only) (Signed)
Patient ID: Ronald Mueller, male   DOB: 04/13/1990, 31 y.o.   MRN: [id number]  HPI Ronald Mueller is a 31 y.o. male seen at the request of Mrs. Poggi PAC  .  He endorses left inguinal pain that radiates to the left testicle.  Seems to be worsening with significant physical efforts and Valsalva maneuvers.  The pain is intermittent sharp and moderate in intensity.  He endorses that the pain has been present on and off for the last couple weeks.  There is no specific alleviating or aggravating factors.  He did have a visit recently to the emergency room and at that time an ultrasound was performed that I have personally reviewed showing no evidence of testicular torsion no evidence of testicular ischemia but evidence of small inguinal hernia on the left side. Patient is able to perform more than 6 METS of activity without any shortness of breath or chest pain.  He does have history of  neurofibromatosis.  No prior inguinal or abdominal operations His work seems to be quite physical.  HPI  Past Medical History:  Diagnosis Date   H/O neurofibromatosis (HCC)     History reviewed. No pertinent surgical history.  History reviewed. No pertinent family history.  Social History Social History   Tobacco Use   Smoking status: Never    Passive exposure: Never   Smokeless tobacco: Never  Substance Use Topics   Alcohol use: No   Drug use: No    No Known Allergies  Current Outpatient Medications  Medication Sig Dispense Refill   ibuprofen (ADVIL) 600 MG tablet Take by mouth.     No current facility-administered medications for this visit.     Review of Systems Full ROS  was asked and was negative except for the information on the HPI  Physical Exam Blood pressure 131/74, pulse 75, temperature 98.2 F (36.8 C), temperature source Oral, height 5' 2" (1.575 m), weight 158 lb 6.4 oz (71.8 kg), SpO2 99 %. CONSTITUTIONAL: NAD. EYES: Pupils are equal, round, and reactive to light, Sclera are  non-icteric. EARS, NOSE, MOUTH AND THROAT: The oropharynx is clear. The oral mucosa is pink and moist. Hearing is intact to voice. LYMPH NODES:  Lymph nodes in the neck are normal. RESPIRATORY:  Lungs are clear. There is normal respiratory effort, with equal breath sounds bilaterally, and without pathologic use of accessory muscles. CARDIOVASCULAR: Heart is regular without murmurs, gallops, or rubs. GI: The abdomen is  soft, nontender, and nondistended. There are no palpable masses. There is no hepatosplenomegaly. There are normal bowel sounds .  There is evidence of mild left inguinal defect that is reducible but tender GU: There is some evidence of testicular tenderness on the left side. MUSCULOSKELETAL: Normal muscle strength and tone. No cyanosis or edema.   SKIN: Turgor is good and there are no pathologic skin lesions or ulcers. NEUROLOGIC: Motor and sensation is grossly normal. Cranial nerves are grossly intact. PSYCH:  Oriented to person, place and time. Affect is normal.  Data Reviewed I have personally reviewed the patient's imaging, laboratory findings and medical records.    Assessment/Plan  31-year-old male with a symptomatic left inguinal hernia.  Discussed with the patient in detail about his disease process.  Given his symptoms I do recommend repair.  Discussion about different approaches to include open versus robotic versus laparoscopically.  I do prefer to have robotic approach in the hands.  Also discussed with him the possibility of repairing bilateral defects if in fact   we find a second defect.  Procedure discussed with patient in detail.  Risk, benefits and possible complications including but not limited to, bleeding, infection, nerve injury, bowel injury, chronic pain specifically the fact that he has preoperative inguinodynia is the most important factor for chronic pain.   Eowyn Tabone, MD FACS General Surgeon 08/11/2022, 1:04 PM   

## 2022-08-15 ENCOUNTER — Encounter
Admission: RE | Admit: 2022-08-15 | Discharge: 2022-08-15 | Disposition: A | Discharge status: Home / Self Care | Payer: Medicaid Other | Source: Ambulatory Visit | Attending: Surgery | Admitting: Surgery

## 2022-08-15 ENCOUNTER — Other Ambulatory Visit: Payer: Self-pay

## 2022-08-15 NOTE — Patient Instructions (Addendum)
Your procedure is scheduled on: 08/23/22 - Thursday Report to the Registration Desk on the 1st floor of the [facility]. To find out your arrival time, please call [phone] between 1PM - 3PM on: 08/22/22 - Wednesday If your arrival time is 6:00 am, do not arrive before that time as the [facility] entrance doors do not open until 6:00 am.  REMEMBER: Instructions that are not followed completely may result in serious medical risk, up to and including death; or upon the discretion of your surgeon and anesthesiologist your surgery may need to be rescheduled.  Do not eat food or drink any liquids after midnight the night before surgery.  No gum chewing or hard candies.   One week prior to surgery: Stop Anti-inflammatories (NSAIDS) such as Advil, Aleve, Ibuprofen, Motrin, Naproxen, Naprosyn and Aspirin based products such as Excedrin, Goody's Powder, BC Powder.  Stop ANY OVER THE COUNTER supplements until after surgery.  You may take Tylenol if needed for pain up until the day of surgery.  TAKE ONLY THESE MEDICATIONS THE MORNING OF SURGERY WITH A SIP OF WATER:  NONE   No Alcohol for 24 hours before or after surgery.  No Smoking including e-cigarettes for 24 hours before surgery.  No chewable tobacco products for at least 6 hours before surgery.  No nicotine patches on the day of surgery.  Do not use any "recreational" drugs for at least a week (preferably 2 weeks) before your surgery.  Please be advised that the combination of cocaine and anesthesia may have negative outcomes, up to and including death. If you test positive for cocaine, your surgery will be cancelled.  On the morning of surgery brush your teeth with toothpaste and water, you may rinse your mouth with mouthwash if you wish. Do not swallow any toothpaste or mouthwash.  Do not wear jewelry, make-up, hairpins, clips or nail polish.  Do not wear lotions, powders, or perfumes.   Do not shave body hair from the  neck down 48 hours before surgery.  Contact lenses, hearing aids and dentures may not be worn into surgery.  Do not bring valuables to the hospital. [facility] is not responsible for any missing/lost belongings or valuables.   Notify your doctor if there is any change in your medical condition (cold, fever, infection).  Wear comfortable clothing (specific to your surgery type) to the hospital.  After surgery, you can help prevent lung complications by doing breathing exercises.  Take deep breaths and cough every 1-2 hours. Your doctor may order a device called an Incentive Spirometer to help you take deep breaths. When coughing or sneezing, hold a pillow firmly against your incision with both hands. This is called "splinting." Doing this helps protect your incision. It also decreases belly discomfort.  If you are being admitted to the hospital overnight, leave your suitcase in the car. After surgery it may be brought to your room.  In case of increased patient census, it may be necessary for you, the patient, to continue your postoperative care in the Same Day Surgery department.  If you are being discharged the day of surgery, you will not be allowed to drive home. You will need a responsible individual to drive you home and stay with you for 24 hours after surgery.   If you are taking public transportation, you will need to have a responsible individual with you.  Please call the Pre-admissions Testing Dept. at [phone] if you have any questions about these instructions.  Surgery Visitation Policy:  Patients having surgery or a procedure may have two visitors.  Children under the age of 36 must have an adult with them who is not the patient.  Inpatient Visitation:    Visiting hours are 7 a.m. to 8 p.m. Up to four visitors are allowed at one time in a patient room. The visitors may rotate out with other people during the day.  One visitor age 35 or older may stay with the  patient overnight and must be in the room by 8 p.m.

## 2022-08-23 ENCOUNTER — Ambulatory Visit: Payer: Medicaid Other | Admitting: Anesthesiology

## 2022-08-23 ENCOUNTER — Other Ambulatory Visit: Payer: Self-pay

## 2022-08-23 ENCOUNTER — Encounter
Admission: RE | Disposition: A | Discharge status: Home / Self Care | Payer: Self-pay | Source: Home / Self Care | Attending: Surgery

## 2022-08-23 ENCOUNTER — Ambulatory Visit
Admission: RE | Admit: 2022-08-23 | Discharge: 2022-08-23 | Disposition: A | Discharge status: Home / Self Care | Payer: Medicaid Other | Attending: Surgery | Admitting: Surgery

## 2022-08-23 ENCOUNTER — Encounter: Payer: Self-pay | Admitting: Surgery

## 2022-08-23 DIAGNOSIS — K4090 Unilateral inguinal hernia, without obstruction or gangrene, not specified as recurrent: Secondary | ICD-10-CM | POA: Diagnosis not present

## 2022-08-23 SURGERY — HERNIORRHAPHY, INGUINAL, ROBOT-ASSISTED, LAPAROSCOPIC
Anesthesia: General | Site: Groin | Laterality: Left

## 2022-08-23 MED ORDER — CELECOXIB 200 MG PO CAPS
ORAL_CAPSULE | ORAL | Status: AC
  Filled 2022-08-23: qty 1

## 2022-08-23 MED ORDER — SODIUM CHLORIDE 0.9 % IV SOLN: INTRAVENOUS | Status: DC | PRN

## 2022-08-23 MED ORDER — EPINEPHRINE PF 1 MG/ML IJ SOLN
INTRAMUSCULAR | Status: AC
  Filled 2022-08-23: qty 1

## 2022-08-23 MED ORDER — CHLORHEXIDINE GLUCONATE CLOTH 2 % EX PADS: 6.0000 | MEDICATED_PAD | Freq: Once | CUTANEOUS | Status: DC

## 2022-08-23 MED ORDER — OXYCODONE HCL 5 MG PO TABS
5.0000 mg | ORAL_TABLET | Freq: Once | ORAL | Status: AC | PRN
  Administered 2022-08-23: 5 mg via ORAL

## 2022-08-23 MED ORDER — LACTATED RINGERS IV SOLN: INTRAVENOUS | Status: DC

## 2022-08-23 MED ORDER — FENTANYL CITRATE (PF) 100 MCG/2ML IJ SOLN: 25.0000 ug | INTRAMUSCULAR | Status: DC | PRN

## 2022-08-23 MED ORDER — BUPIVACAINE LIPOSOME 1.3 % IJ SUSP
INTRAMUSCULAR | Status: DC | PRN
  Administered 2022-08-23: 20 mL

## 2022-08-23 MED ORDER — CELECOXIB 200 MG PO CAPS
200.0000 mg | ORAL_CAPSULE | ORAL | Status: AC
  Administered 2022-08-23: 200 mg via ORAL

## 2022-08-23 MED ORDER — FAMOTIDINE 20 MG PO TABS
20.0000 mg | ORAL_TABLET | Freq: Once | ORAL | Status: AC
  Administered 2022-08-23: 20 mg via ORAL

## 2022-08-23 MED ORDER — HYDROCODONE-ACETAMINOPHEN 5-325 MG PO TABS
1.0000 | ORAL_TABLET | ORAL | 0 refills | Status: DC | PRN
Start: 2022-08-23 — End: 2022-09-10

## 2022-08-23 MED ORDER — ONDANSETRON HCL 4 MG/2ML IJ SOLN
INTRAMUSCULAR | Status: DC | PRN
  Administered 2022-08-23: 4 mg via INTRAVENOUS

## 2022-08-23 MED ORDER — OXYCODONE HCL 5 MG/5ML PO SOLN: 5.0000 mg | Freq: Once | ORAL | Status: AC | PRN

## 2022-08-23 MED ORDER — HYDROMORPHONE HCL 1 MG/ML IJ SOLN
INTRAMUSCULAR | Status: AC
  Filled 2022-08-23: qty 1

## 2022-08-23 MED ORDER — FENTANYL CITRATE (PF) 100 MCG/2ML IJ SOLN
INTRAMUSCULAR | Status: AC
  Filled 2022-08-23: qty 2

## 2022-08-23 MED ORDER — BUPIVACAINE-EPINEPHRINE 0.25% -1:200000 IJ SOLN
INTRAMUSCULAR | Status: DC | PRN
  Administered 2022-08-23: 30 mL

## 2022-08-23 MED ORDER — BUPIVACAINE HCL (PF) 0.25 % IJ SOLN
INTRAMUSCULAR | Status: AC
  Filled 2022-08-23: qty 30

## 2022-08-23 MED ORDER — GABAPENTIN 300 MG PO CAPS
ORAL_CAPSULE | ORAL | Status: AC
  Filled 2022-08-23: qty 1

## 2022-08-23 MED ORDER — GABAPENTIN 300 MG PO CAPS
300.0000 mg | ORAL_CAPSULE | ORAL | Status: AC
  Administered 2022-08-23: 300 mg via ORAL

## 2022-08-23 MED ORDER — SUGAMMADEX SODIUM 200 MG/2ML IV SOLN
INTRAVENOUS | Status: DC | PRN
  Administered 2022-08-23: 200 mg via INTRAVENOUS

## 2022-08-23 MED ORDER — DEXAMETHASONE SODIUM PHOSPHATE 10 MG/ML IJ SOLN
INTRAMUSCULAR | Status: DC | PRN
  Administered 2022-08-23: 10 mg via INTRAVENOUS

## 2022-08-23 MED ORDER — PROPOFOL 10 MG/ML IV BOLUS
INTRAVENOUS | Status: DC | PRN
  Administered 2022-08-23: 150 mg via INTRAVENOUS

## 2022-08-23 MED ORDER — FENTANYL CITRATE (PF) 100 MCG/2ML IJ SOLN
INTRAMUSCULAR | Status: DC | PRN
  Administered 2022-08-23: 100 ug via INTRAVENOUS

## 2022-08-23 MED ORDER — BUPIVACAINE LIPOSOME 1.3 % IJ SUSP
INTRAMUSCULAR | Status: AC
  Filled 2022-08-23: qty 20

## 2022-08-23 MED ORDER — DEXMEDETOMIDINE HCL IN NACL 80 MCG/20ML IV SOLN
INTRAVENOUS | Status: DC | PRN
  Administered 2022-08-23: 4 ug via INTRAVENOUS
  Administered 2022-08-23: 8 ug via INTRAVENOUS

## 2022-08-23 MED ORDER — CHLORHEXIDINE GLUCONATE 0.12 % MT SOLN
OROMUCOSAL | Status: AC
  Filled 2022-08-23: qty 15

## 2022-08-23 MED ORDER — ROCURONIUM BROMIDE 100 MG/10ML IV SOLN
INTRAVENOUS | Status: DC | PRN
  Administered 2022-08-23: 60 mg via INTRAVENOUS

## 2022-08-23 MED ORDER — CEFAZOLIN SODIUM-DEXTROSE 2-4 GM/100ML-% IV SOLN
2.0000 g | INTRAVENOUS | Status: AC
  Administered 2022-08-23: 2 g via INTRAVENOUS

## 2022-08-23 MED ORDER — 0.9 % SODIUM CHLORIDE (POUR BTL) OPTIME
TOPICAL | Status: DC | PRN
  Administered 2022-08-23: 500 mL

## 2022-08-23 MED ORDER — ORAL CARE MOUTH RINSE: 15.0000 mL | Freq: Once | OROMUCOSAL | Status: AC

## 2022-08-23 MED ORDER — HYDROMORPHONE HCL 1 MG/ML IJ SOLN
INTRAMUSCULAR | Status: DC | PRN
  Administered 2022-08-23: 1 mg via INTRAVENOUS

## 2022-08-23 MED ORDER — MIDAZOLAM HCL 2 MG/2ML IJ SOLN
INTRAMUSCULAR | Status: AC
  Filled 2022-08-23: qty 2

## 2022-08-23 MED ORDER — FAMOTIDINE 20 MG PO TABS
ORAL_TABLET | ORAL | Status: AC
  Filled 2022-08-23: qty 1

## 2022-08-23 MED ORDER — ACETAMINOPHEN 500 MG PO TABS
ORAL_TABLET | ORAL | Status: AC
  Filled 2022-08-23: qty 2

## 2022-08-23 MED ORDER — LIDOCAINE HCL (CARDIAC) PF 100 MG/5ML IV SOSY
PREFILLED_SYRINGE | INTRAVENOUS | Status: DC | PRN
  Administered 2022-08-23: 100 mg via INTRAVENOUS

## 2022-08-23 MED ORDER — CHLORHEXIDINE GLUCONATE 0.12 % MT SOLN
15.0000 mL | Freq: Once | OROMUCOSAL | Status: AC
  Administered 2022-08-23: 15 mL via OROMUCOSAL

## 2022-08-23 MED ORDER — CEFAZOLIN SODIUM-DEXTROSE 2-4 GM/100ML-% IV SOLN
INTRAVENOUS | Status: AC
  Filled 2022-08-23: qty 100

## 2022-08-23 MED ORDER — MIDAZOLAM HCL 2 MG/2ML IJ SOLN
INTRAMUSCULAR | Status: DC | PRN
  Administered 2022-08-23: 2 mg via INTRAVENOUS

## 2022-08-23 MED ORDER — ACETAMINOPHEN 500 MG PO TABS
1000.0000 mg | ORAL_TABLET | ORAL | Status: AC
  Administered 2022-08-23: 1000 mg via ORAL

## 2022-08-23 MED ORDER — OXYCODONE HCL 5 MG PO TABS
ORAL_TABLET | ORAL | Status: AC
  Filled 2022-08-23: qty 1

## 2022-08-23 SURGICAL SUPPLY — 48 items
ADH SKN CLS APL DERMABOND .7 (GAUZE/BANDAGES/DRESSINGS) ×1
BLADE CLIPPER SURG (BLADE) IMPLANT
CANNULA REDUCER 12-8 DVNC XI (CANNULA) ×1 IMPLANT
COVER TIP SHEARS 8 DVNC (MISCELLANEOUS) ×1 IMPLANT
COVER WAND RF STERILE (DRAPES) ×1 IMPLANT
DERMABOND ADVANCED .7 DNX12 (GAUZE/BANDAGES/DRESSINGS) ×1 IMPLANT
DRAPE ARM DVNC X/XI (DISPOSABLE) ×3 IMPLANT
DRAPE COLUMN DVNC XI (DISPOSABLE) ×1 IMPLANT
ELECT CAUTERY BLADE 6.4 (BLADE) ×1 IMPLANT
ELECT REM PT RETURN 9FT ADLT (ELECTROSURGICAL) ×1
ELECTRODE REM PT RTRN 9FT ADLT (ELECTROSURGICAL) ×1 IMPLANT
FORCEPS BPLR R/ABLATION 8 DVNC (INSTRUMENTS) ×1 IMPLANT
FORCEPS PROGRASP DVNC XI (FORCEP) ×1 IMPLANT
GLOVE BIO SURGEON STRL SZ7 (GLOVE) ×4 IMPLANT
GOWN STRL REUS W/ TWL LRG LVL3 (GOWN DISPOSABLE) ×4 IMPLANT
GOWN STRL REUS W/TWL LRG LVL3 (GOWN DISPOSABLE) ×3
IRRIGATION STRYKERFLOW (MISCELLANEOUS) ×1 IMPLANT
IRRIGATOR STRYKERFLOW (MISCELLANEOUS)
IV NS 1000ML (IV SOLUTION)
IV NS 1000ML BAXH (IV SOLUTION) IMPLANT
KIT PINK PAD W/HEAD ARE REST (MISCELLANEOUS) ×1
KIT PINK PAD W/HEAD ARM REST (MISCELLANEOUS) ×1 IMPLANT
LABEL OR SOLS (LABEL) ×1 IMPLANT
MANIFOLD NEPTUNE II (INSTRUMENTS) ×1 IMPLANT
MESH 3DMAX 5X7 LT XLRG (Mesh General) IMPLANT
NDL DRIVE SUT CUT DVNC (INSTRUMENTS) ×1 IMPLANT
NDL HYPO 22X1.5 SAFETY MO (MISCELLANEOUS) ×1 IMPLANT
NEEDLE DRIVE SUT CUT DVNC (INSTRUMENTS) ×1 IMPLANT
NEEDLE HYPO 22X1.5 SAFETY MO (MISCELLANEOUS) ×1 IMPLANT
NS IRRIG 500ML POUR BTL (IV SOLUTION) IMPLANT
OBTURATOR OPTICAL STND 8 DVNC (TROCAR) ×1
OBTURATOR OPTICALSTD 8 DVNC (TROCAR) ×1 IMPLANT
PACK LAP CHOLECYSTECTOMY (MISCELLANEOUS) ×1 IMPLANT
PENCIL SMOKE EVACUATOR (MISCELLANEOUS) ×1 IMPLANT
SCISSORS MNPLR CVD DVNC XI (INSTRUMENTS) ×1 IMPLANT
SEAL UNIV 5-12 XI (MISCELLANEOUS) ×2 IMPLANT
SET TUBE SMOKE EVAC HIGH FLOW (TUBING) ×1 IMPLANT
SOL ELECTROSURG ANTI STICK (MISCELLANEOUS) ×1
SOLUTION ELECTROSURG ANTI STCK (MISCELLANEOUS) ×1 IMPLANT
SPONGE T-LAP 18X18 ~~LOC~~+RFID (SPONGE) ×1 IMPLANT
SUT MNCRL AB 4-0 PS2 18 (SUTURE) ×1 IMPLANT
SUT V-LOC 90 ABS 3-0 VLT  V-20 (SUTURE) ×2
SUT V-LOC 90 ABS 3-0 VLT V-20 (SUTURE) ×2 IMPLANT
SUT VICRYL 0 UR6 27IN ABS (SUTURE) ×2 IMPLANT
SYR 30ML LL (SYRINGE) ×1 IMPLANT
TAPE TRANSPORE STRL 2 31045 (GAUZE/BANDAGES/DRESSINGS) ×1 IMPLANT
TRAP FLUID SMOKE EVACUATOR (MISCELLANEOUS) ×1 IMPLANT
WATER STERILE IRR 500ML POUR (IV SOLUTION) ×1 IMPLANT

## 2022-08-23 NOTE — Interval H&P Note (Signed)
History and Physical Interval Note:  08/23/2022 8:11 AM  Ronald Mueller  has presented today for surgery, with the diagnosis of left inguinal hernia.  The various methods of treatment have been discussed with the patient and family. After consideration of risks, benefits and other options for treatment, the patient has consented to  Procedure(s): XI ROBOTIC ASSISTED INGUINAL HERNIA (Left) as a surgical intervention.  The patient's history has been reviewed, patient examined, no change in status, stable for surgery.  I have reviewed the patient's chart and labs.  Questions were answered to the patient's satisfaction.     Giovana Faciane F Arleene Settle

## 2022-08-23 NOTE — Transfer of Care (Signed)
Immediate Anesthesia Transfer of Care Note  Patient: Ronald Mueller  Procedure(s) Performed: XI ROBOTIC ASSISTED INGUINAL HERNIA (Left: Groin)  Patient Location: PACU  Anesthesia Type:General  Level of Consciousness: drowsy  Airway & Oxygen Therapy: Patient Spontanous Breathing and Patient connected to nasal cannula oxygen  Post-op Assessment: Report given to RN and Post -op Vital signs reviewed and stable  Post vital signs: stable  Last Vitals:  Vitals Value Taken Time  BP 130/75 08/23/22 1420  Temp    Pulse 87 08/23/22 1423  Resp 20 08/23/22 1423  SpO2 96 % 08/23/22 1423  Vitals shown include unvalidated device data.  Last Pain:  Vitals:   08/23/22 0831  TempSrc: Temporal  PainSc: 0-No pain         Complications: No notable events documented.

## 2022-08-23 NOTE — Anesthesia Preprocedure Evaluation (Signed)
Anesthesia Evaluation  Patient identified by MRN, date of birth, ID band Patient awake    Reviewed: Allergy & Precautions, NPO status , Patient's Chart, lab work & pertinent test results  History of Anesthesia Complications Negative for: history of anesthetic complications  Airway Mallampati: II  TM Distance: >3 FB Neck ROM: full    Dental  (+) Chipped   Pulmonary neg pulmonary ROS, neg shortness of breath   Pulmonary exam normal        Cardiovascular Exercise Tolerance: Good (-) angina (-) Past MI negative cardio ROS Normal cardiovascular exam     Neuro/Psych negative neurological ROS  negative psych ROS   GI/Hepatic negative GI ROS, Neg liver ROS,neg GERD  ,,  Endo/Other  negative endocrine ROS    Renal/GU      Musculoskeletal   Abdominal   Peds  Hematology negative hematology ROS (+)   Anesthesia Other Findings Past Medical History: No date: H/O neurofibromatosis (HCC)  History reviewed. No pertinent surgical history.     Reproductive/Obstetrics negative OB ROS                             Anesthesia Physical Anesthesia Plan  ASA: 2  Anesthesia Plan: General ETT   Post-op Pain Management:    Induction: Intravenous  PONV Risk Score and Plan: Ondansetron, Dexamethasone, Midazolam and Treatment may vary due to age or medical condition  Airway Management Planned: Oral ETT  Additional Equipment:   Intra-op Plan:   Post-operative Plan: Extubation in OR  Informed Consent: I have reviewed the patients History and Physical, chart, labs and discussed the procedure including the risks, benefits and alternatives for the proposed anesthesia with the patient or authorized representative who has indicated his/her understanding and acceptance.     Dental Advisory Given  Plan Discussed with: Anesthesiologist, CRNA and Surgeon  Anesthesia Plan Comments: (Patient consented for  risks of anesthesia including but not limited to:  - adverse reactions to medications - damage to eyes, teeth, lips or other oral mucosa - nerve damage due to positioning  - sore throat or hoarseness - Damage to heart, brain, nerves, lungs, other parts of body or loss of life  Patient voiced understanding.)       Anesthesia Quick Evaluation

## 2022-08-23 NOTE — Op Note (Signed)
Robotic assisted Laparoscopic Transabdominal Left Inguinal Hernia Repair with 3 D large Mesh       Pre-operative Diagnosis:  left Inguinal Hernia   Post-operative Diagnosis: Same   Procedure: Robotic  Laparoscopic  repair of Left inguinal hernia   Surgeon: Sterling Big, MD FACS   Anesthesia: Gen. with endotracheal tube   Findings: Indirect Left inguinal hernia        Procedure Details  The patient was seen again in the Holding Room. The benefits, complications, treatment options, and expected outcomes were discussed with the patient. The risks of bleeding, infection, recurrence of symptoms, failure to resolve symptoms, recurrence of hernia, ischemic orchitis, chronic pain syndrome or neuroma, were discussed again. The likelihood of improving the patient's symptoms with return to their baseline status is good.  The patient and/or family concurred with the proposed plan, giving informed consent.  The patient was taken to Operating Room, identified  and the procedure verified as Laparoscopic Inguinal Hernia Repair. Laterality confirmed.  A Time Out was held and the above information confirmed.   Prior to the induction of general anesthesia, antibiotic prophylaxis was administered. VTE prophylaxis was in place. General endotracheal anesthesia was then administered and tolerated well. After the induction, the abdomen was prepped with Chloraprep and draped in the sterile fashion. The patient was positioned in the supine position.     Supraumbilical incision created and cut down technique used to enter the abdominal cavity. Fascia elevated and incised and hasson trochar placed. Pneumoperitoneum obtained w/o HD changes. No evidence of bowel injuries.  Two 8 mm placed under direct vision. The laparoscopy revealed large indirect defects. I inserted the needles and the mesh. The robot was brought ot the table and docked in the standard fashion, no collision between arms was observed. Instruments were  kept under direct view at all times. Attention then was turned to the left side were a flap was created. The sac was reduced and dissected free from adjacent structures. We preserved the vas and the vessels. Once dissection was completed a X large 3D mesh was placed and secured with two interrupted vicryl attached to the pubic tubercle. There was good coverage of the direct, indirect and femoral spaces. The flap was closed with v lock suture. Second look revealed no complications or injuries, no evidence of contralateral defects     Once assuring that hemostasis was adequate the ports were removed and a figure-of-eight 0 Vicryl suture was placed at the fascial edges. 4-0 subcuticular Monocryl was used at all skin edges. Dermabond was placed.  Patient tolerated the procedure well. There were no complications. He was taken to the recovery room in stable condition.                 Sterling Big, MD, FACS

## 2022-08-23 NOTE — Anesthesia Procedure Notes (Signed)
Procedure Name: Intubation Date/Time: 08/23/2022 12:59 PM  Performed by: Maryla Morrow., CRNAPre-anesthesia Checklist: Patient identified, Patient being monitored, Timeout performed, Emergency Drugs available and Suction available Patient Re-evaluated:Patient Re-evaluated prior to induction Oxygen Delivery Method: Circle system utilized Preoxygenation: Pre-oxygenation with 100% oxygen Induction Type: IV induction Ventilation: Mask ventilation without difficulty Laryngoscope Size: McGraph Grade View: Grade I Tube type: Oral Tube size: 7.0 mm Number of attempts: 1 Airway Equipment and Method: Stylet Placement Confirmation: ETT inserted through vocal cords under direct vision, positive ETCO2 and breath sounds checked- equal and bilateral Secured at: 22 cm Tube secured with: Tape Dental Injury: Teeth and Oropharynx as per pre-operative assessment

## 2022-08-23 NOTE — Discharge Instructions (Addendum)
Laparoscopic Inguinal Hernia Repair, Adult, Care After The following information offers guidance on how to care for yourself after your procedure. Your health care provider may also give you more specific instructions. If you have problems or questions, contact your health care provider. What can I expect after the procedure? After the procedure, it is common to have: Pain. Swelling and bruising around the incision area. Scrotal swelling, in males. Some fluid or blood draining from your incisions. Follow these instructions at home: Medicines Take over-the-counter and prescription medicines only as told by your health care provider. Ask your health care provider if the medicine prescribed to you: Requires you to avoid driving or using machinery. Can cause constipation. You may need to take these actions to prevent or treat constipation: Drink enough fluid to keep your urine pale yellow. Take over-the-counter or prescription medicines. Eat foods that are high in fiber, such as beans, whole grains, and fresh fruits and vegetables. Limit foods that are high in fat and processed sugars, such as fried or sweet foods. Incision care  Follow instructions from your health care provider about how to take care of your incisions. Make sure you: Wash your hands with soap and water for at least 20 seconds before and after you change your bandage (dressing). If soap and water are not available, use hand sanitizer. Change your dressing as told by your health care provider. Leave stitches (sutures), skin glue, or adhesive strips in place. These skin closures may need to stay in place for 2 weeks or longer. If adhesive strip edges start to loosen and curl up, you may trim the loose edges. Do not remove adhesive strips completely unless your health care provider tells you to do that. Check your incision area every day for signs of infection. Check for: More redness, swelling, or pain. More fluid or  blood. Warmth. Pus or a bad smell. Wear loose, soft clothing while your incisions heal. Managing pain and swelling  If directed, put ice on the painful or swollen areas. To do this: Put ice in a plastic bag. Place a towel between your skin and the bag. Leave the ice on for 20 minutes, 2-3 times a day. Remove the ice if your skin turns bright red. This is very important. If you cannot feel pain, heat, or cold, you have a greater risk of damage to the area.   Activity Do not lift anything that is heavier than 10 lb (4.5 kg), or the limit that you are told, until your health care provider says that it is safe. Ask your health care provider what activities are safe for you. A lot of activity during the first week after surgery can increase pain and swelling. For 1 week after your procedure: Avoid activities that take a lot of effort, such as exercise or sports. You may walk and climb stairs as needed for daily activity, but avoid long walks or climbing stairs for exercise. General instructions If you were given a sedative during the procedure, it can affect you for several hours. Do not drive or operate machinery until your health care provider says that it is safe. Do not take baths, swim, or use a hot tub until your health care provider approves. Ask your health care provider if you may take showers. You may only be allowed to take sponge baths. Do not use any products that contain nicotine or tobacco. These products include cigarettes, chewing tobacco, and vaping devices, such as e-cigarettes. If you need help quitting, ask  your health care provider. Keep all follow-up visits. This is important. Contact a health care provider if: You have any of these signs of infection: More redness, swelling, or pain around your incisions or your groin area. More fluid or blood coming from an incision. Warmth coming from an incision. Pus or a bad smell coming from an incision. A fever or chills. You  have more swelling in your scrotum, if you are male. You have severe pain and medicines do not help. You have abdominal pain or swelling. You cannot urinate or have a bowel movement. You faint or feel dizzy. You have nausea and vomiting. Get help right away if: You have redness, warmth, or pain in your leg. You have chest pain. You have problems breathing. These symptoms may represent a serious problem that is an emergency. Do not wait to see if the symptoms will go away. Get medical help right away. Call your local emergency services (911 in the [place] Do not drive yourself to the hospital. Summary Pain, swelling, and bruising are common after the procedure. Check your incision area every day for signs of infection, such as more redness, swelling, or pain. Put ice on painful or swollen areas for 20 minutes, 2-3 times a day. This information is not intended to replace advice given to you by your health care provider. Make sure you discuss any questions you have with your health care provider. Document Revised: 11/10/2019 Document Reviewed: 11/10/2019 Elsevier Patient Education  2024 [employer]     AMBULATORY SURGERY  DISCHARGE INSTRUCTIONS   The drugs that you were given will stay in your system until tomorrow so for the next 24 hours you should not:  Drive an automobile Make any legal decisions Drink any alcoholic beverage   You may resume regular meals tomorrow.  Today it is better to start with liquids and gradually work up to solid foods.  You may eat anything you prefer, but it is better to start with liquids, then soup and crackers, and gradually work up to solid foods.   Please notify your doctor immediately if you have any unusual bleeding, trouble breathing, redness and pain at the surgery site, drainage, fever, or pain not relieved by medication.    Additional Instructions: PLEASE LEAVE GREEN/TEAL BRACELET ON FOR 4 DAYS        Please contact your  physician with any problems or Same Day Surgery at [phone], Monday through Friday 6 am to 4 pm, or [place] at [facility] number at [phone].

## 2022-08-24 NOTE — Anesthesia Postprocedure Evaluation (Signed)
Anesthesia Post Note  Patient: Ronald Mueller  Procedure(s) Performed: XI ROBOTIC ASSISTED INGUINAL HERNIA (Left: Groin)  Patient location during evaluation: PACU Anesthesia Type: General Level of consciousness: awake and alert Pain management: pain level controlled Vital Signs Assessment: post-procedure vital signs reviewed and stable Respiratory status: spontaneous breathing, nonlabored ventilation, respiratory function stable and patient connected to nasal cannula oxygen Cardiovascular status: blood pressure returned to baseline and stable Postop Assessment: no apparent nausea or vomiting Anesthetic complications: no   There were no known notable events for this encounter.   Last Vitals:  Vitals:   08/23/22 1500 08/23/22 1508  BP: 123/69 132/73  Pulse: 84 80  Resp: 14 18  Temp:  36.4 C  SpO2: 96% 94%    Last Pain:  Vitals:   08/23/22 1508  TempSrc: Temporal  PainSc: 5                  Cleda Mccreedy Kaliyan Osbourn

## 2022-08-27 ENCOUNTER — Telehealth: Payer: Self-pay | Admitting: Surgery

## 2022-08-27 NOTE — Telephone Encounter (Signed)
Patient had robotic inguinal hernia surgery on 08/23/22 with Dr. Everlene Farrier.  His post op is scheduled for 09/10/22.  Patient is in [employer], so there is a lot of physical labor with heavy lifting.  States overall is feeling ok, there is still some pain.  Wants to know when he can return back to work and will need a work note stating just that. With his type of work, there really is no light restrictions. Please call to advise and/or when note for work is ready.  Thank you.

## 2022-08-29 ENCOUNTER — Encounter: Payer: Self-pay | Admitting: Surgery

## 2022-09-10 ENCOUNTER — Encounter: Payer: Self-pay | Admitting: Surgery

## 2022-09-10 ENCOUNTER — Ambulatory Visit (INDEPENDENT_AMBULATORY_CARE_PROVIDER_SITE_OTHER): Payer: Medicaid Other | Admitting: Surgery

## 2022-09-10 VITALS — BP 115/72 | HR 69 | Temp 98.1°F | Ht 62.0 in | Wt 158.0 lb

## 2022-09-10 DIAGNOSIS — K4090 Unilateral inguinal hernia, without obstruction or gangrene, not specified as recurrent: Secondary | ICD-10-CM

## 2022-09-10 DIAGNOSIS — Z09 Encounter for follow-up examination after completed treatment for conditions other than malignant neoplasm: Secondary | ICD-10-CM

## 2022-09-10 NOTE — Progress Notes (Signed)
32 year old G1 now weeks following robotic left inguinal hernia repair.  He has done very well. No fevers no chills.  There is some mild point tenderness in the left side.  PE NAD Abd: Soft there is minimal pinpoint tenderness in the left side without evidence of recurrence.  Rest of the incisions healing well without infection hematomas or seromas.  A/p  Doing very well after robotic left inguinal hernia repair.  No evidence of complications.  Follow lifting restrictions.  RTC as needed

## 2022-09-10 NOTE — Patient Instructions (Signed)

## 2023-03-13 ENCOUNTER — Emergency Department
Admission: EM | Admit: 2023-03-13 | Discharge: 2023-03-13 | Disposition: A | Discharge status: Home / Self Care | Payer: Medicaid Other | Attending: Emergency Medicine | Admitting: Emergency Medicine

## 2023-03-13 ENCOUNTER — Other Ambulatory Visit: Payer: Self-pay

## 2023-03-13 DIAGNOSIS — R369 Urethral discharge, unspecified: Secondary | ICD-10-CM | POA: Diagnosis present

## 2023-03-13 LAB — CHLAMYDIA/NGC RT PCR (ARMC ONLY)
Chlamydia Tr: NOT DETECTED
N gonorrhoeae: DETECTED — AB

## 2023-03-13 LAB — URINALYSIS, ROUTINE W REFLEX MICROSCOPIC
Bacteria, UA: NONE SEEN
Bilirubin Urine: NEGATIVE
Glucose, UA: NEGATIVE mg/dL
Hgb urine dipstick: NEGATIVE
Ketones, ur: NEGATIVE mg/dL
Nitrite: NEGATIVE
Protein, ur: NEGATIVE mg/dL
Specific Gravity, Urine: 1.027 (ref 1.005–1.030)
Squamous Epithelial / HPF: 0 /[HPF] (ref 0–5)
WBC, UA: >50 WBC/hpf (ref 0–5)
pH: 5 (ref 5.0–8.0)

## 2023-03-13 MED ORDER — LIDOCAINE HCL (PF) 1 % IJ SOLN
5.0000 mL | Freq: Once | INTRAMUSCULAR | Status: AC
  Administered 2023-03-13: 5 mL
  Filled 2023-03-13: qty 5

## 2023-03-13 MED ORDER — DOXYCYCLINE HYCLATE 100 MG PO CAPS: 100.0000 mg | ORAL_CAPSULE | Freq: Two times a day (BID) | ORAL | 0 refills | Status: DC

## 2023-03-13 MED ORDER — CEFTRIAXONE SODIUM 500 MG IJ SOLR
500.0000 mg | Freq: Once | INTRAMUSCULAR | Status: AC
  Administered 2023-03-13: 500 mg via INTRAMUSCULAR
  Filled 2023-03-13 (×2): qty 500

## 2023-03-13 NOTE — ED Triage Notes (Signed)
Patient states white penile discharge and burning with urination since this morning.

## 2023-03-13 NOTE — Discharge Instructions (Signed)
Follow with your primary care provider if any continued problems or concerns.  Today your urine shows a lot of infection which is suspicious for sexually transmitted infection.  Your partner can be seen, tested and treated at the [facility] department and information was given to you about this since it is free.  A prescription for doxycycline was sent to the pharmacy for you to begin taking, you will need to take the entire course of antibiotics.  No sexual intercourse for approximately 7 days or until your sexual partner has been tested and treated.

## 2023-03-13 NOTE — ED Provider Notes (Signed)
[facility] Provider Note    None    (approximate)   History   Exposure to STD   HPI  Ronald Mueller is a 32 y.o. male   woke this am with penile discharge.  Patient states that he only has had intercourse with his wife and not aware that she is having any problems.  No dysuria or frequency.        Physical Exam   Triage Vital Signs: ED Triage Vitals  Encounter Vitals Group     BP 03/13/23 0841 133/70     Systolic BP Percentile --      Diastolic BP Percentile --      Pulse Rate 03/13/23 0841 90     Resp 03/13/23 0841 20     Temp 03/13/23 0841 98 F (36.7 C)     Temp Source 03/13/23 0841 Oral     SpO2 03/13/23 0841 99 %     Weight 03/13/23 0840 160 lb (72.6 kg)     Height 03/13/23 0840 5\' 2"  (1.575 m)     Head Circumference --      Peak Flow --      Pain Score 03/13/23 0839 0     Pain Loc --      Pain Education --      Exclude from Growth Chart --     Most recent vital signs: Vitals:   03/13/23 0841 03/13/23 1016  BP: 133/70 111/62  Pulse: 90 69  Resp: 20 16  Temp: 98 F (36.7 C) 98.6 F (37 C)  SpO2: 99% 100%     General: Awake, no distress. CV:  Good peripheral perfusion.  Resp:  Normal effort.  Abd:  No distention.  Other:     ED Results / Procedures / Treatments   Labs (all labs ordered are listed, but only abnormal results are displayed) Labs Reviewed  URINALYSIS, ROUTINE W REFLEX MICROSCOPIC - Abnormal; Notable for the following components:      Result Value   Color, Urine YELLOW (*)    APPearance HAZY (*)    Leukocytes,Ua SMALL (*)    All other components within normal limits  CHLAMYDIA/NGC RT PCR ([facility] ONLY)                PROCEDURES:  Critical Care performed:   Procedures   MEDICATIONS ORDERED IN ED: Medications  cefTRIAXone (ROCEPHIN) injection 500 mg (500 mg Intramuscular Given 03/13/23 1005)  lidocaine (PF) (XYLOCAINE) 1 % injection 5 mL (5 mLs Infiltration Given 03/13/23 1005)      IMPRESSION / MDM / ASSESSMENT AND PLAN / ED COURSE  I reviewed the triage vital signs and the nursing notes.   Differential diagnosis includes, but is not limited to,  STD, urethritis, UTI  32 yo male is here with penile discharge that started this am.  Urine results is suspicious for STD and patient agrees with Rocephin injection and oral RX for antibiotics.  Information was given for follow up for his wife and him if needed.       Patient's presentation is most consistent with acute complicated illness / injury requiring diagnostic workup.  FINAL CLINICAL IMPRESSION(S) / ED DIAGNOSES   Final diagnoses:  Penile discharge     Rx / DC Orders   ED Discharge Orders          Ordered    doxycycline (VIBRAMYCIN) 100 MG capsule  2 times daily        03/13/23 0942  Note:  This document was prepared using Dragon voice recognition software and may include unintentional dictation errors.   Tommi Rumps, PA-C 03/13/23 1313    Corena Herter, MD 03/13/23 [phone]

## 2023-03-18 ENCOUNTER — Ambulatory Visit: Payer: Self-pay

## 2023-03-18 NOTE — Telephone Encounter (Signed)
  Chief Complaint: medication assistance  Symptoms: NA Frequency: 03/13/23 Pertinent Negatives: NA Disposition: [] ED /[] Urgent Care (no appt availability in office) / [] Appointment(In office/virtual)/ []  [facility]/ [x] Home Care/ [] Refused Recommended Disposition /[] [place]/ []  Follow-up with PCP Additional Notes: advised pt gonorrhea test came back positive, per ED notes complete Rocephin and oral abx. Pt verbalized understanding.   Summary: Medication Advice   Pt is calling to ask questions regarding prescribed medication doxycycline (VIBRAMYCIN) 100 MG capsule [[id number]]- That was prescribed prior to test results coming back. Pt would like to know does he still need to take medication? Or was shot enough?       Reason for Disposition  Caller has medicine question only, adult not sick, AND triager answers question  Answer Assessment - Initial Assessment Questions 1. NAME of MEDICINE: "What medicine(s) are you calling about?"     doxycycline 2. QUESTION: "What is your question?" (e.g., double dose of medicine, side effect)     Wanting to know does he need to take the abx or just Rocephin shot ok? 3. PRESCRIBER: "Who prescribed the medicine?" Reason: if prescribed by specialist, call should be referred to that group.     ED provider  Protocols used: Medication Question Call-A-AH

## 2023-05-08 ENCOUNTER — Encounter: Payer: Self-pay | Admitting: Emergency Medicine

## 2023-05-08 ENCOUNTER — Emergency Department
Admission: EM | Admit: 2023-05-08 | Discharge: 2023-05-08 | Disposition: A | Discharge status: Home / Self Care | Payer: Medicaid Other | Attending: Student in an Organized Health Care Education/Training Program | Admitting: Student in an Organized Health Care Education/Training Program

## 2023-05-08 ENCOUNTER — Other Ambulatory Visit: Payer: Self-pay

## 2023-05-08 DIAGNOSIS — Y990 Civilian activity done for income or pay: Secondary | ICD-10-CM | POA: Insufficient documentation

## 2023-05-08 DIAGNOSIS — R1032 Left lower quadrant pain: Secondary | ICD-10-CM | POA: Insufficient documentation

## 2023-05-08 DIAGNOSIS — R1031 Right lower quadrant pain: Secondary | ICD-10-CM | POA: Insufficient documentation

## 2023-05-08 DIAGNOSIS — R1030 Lower abdominal pain, unspecified: Secondary | ICD-10-CM

## 2023-05-08 DIAGNOSIS — X500XXA Overexertion from strenuous movement or load, initial encounter: Secondary | ICD-10-CM | POA: Insufficient documentation

## 2023-05-08 LAB — COMPREHENSIVE METABOLIC PANEL
ALT: 32 U/L (ref 0–44)
AST: 26 U/L (ref 15–41)
Albumin: 4.1 g/dL (ref 3.5–5.0)
Alkaline Phosphatase: 45 U/L (ref 38–126)
Anion gap: 11 (ref 5–15)
BUN: 12 mg/dL (ref 6–20)
CO2: 26 mmol/L (ref 22–32)
Calcium: 8.7 mg/dL — ABNORMAL LOW (ref 8.9–10.3)
Chloride: 101 mmol/L (ref 98–111)
Creatinine, Ser: 0.9 mg/dL (ref 0.61–1.24)
GFR, Estimated: >60 mL/min (ref >60)
Glucose, Bld: 100 mg/dL — ABNORMAL HIGH (ref 70–99)
Potassium: 3.8 mmol/L (ref 3.5–5.1)
Sodium: 138 mmol/L (ref 135–145)
Total Bilirubin: 0.5 mg/dL (ref 0.0–1.2)
Total Protein: 6.8 g/dL (ref 6.5–8.1)

## 2023-05-08 LAB — CBC WITH DIFFERENTIAL/PLATELET
Abs Immature Granulocytes: 0.22 10*3/uL — ABNORMAL HIGH (ref 0.00–0.07)
Basophils Absolute: 0.1 10*3/uL (ref 0.0–0.1)
Basophils Relative: 2 %
Eosinophils Absolute: 0.4 10*3/uL (ref 0.0–0.5)
Eosinophils Relative: 7 %
HCT: 44.8 % (ref 39.0–52.0)
Hemoglobin: 15.2 g/dL (ref 13.0–17.0)
Immature Granulocytes: 4 %
Lymphocytes Relative: 25 %
Lymphs Abs: 1.6 10*3/uL (ref 0.7–4.0)
MCH: 30.1 pg (ref 26.0–34.0)
MCHC: 33.9 g/dL (ref 30.0–36.0)
MCV: 88.7 fL (ref 80.0–100.0)
Monocytes Absolute: 0.7 10*3/uL (ref 0.1–1.0)
Monocytes Relative: 11 %
Neutro Abs: 3.2 10*3/uL (ref 1.7–7.7)
Neutrophils Relative %: 51 %
Platelets: 306 10*3/uL (ref 150–400)
RBC: 5.05 MIL/uL (ref 4.22–5.81)
RDW: 13.3 % (ref 11.5–15.5)
WBC: 6.2 10*3/uL (ref 4.0–10.5)
nRBC: 0 % (ref 0.0–0.2)

## 2023-05-08 LAB — URINALYSIS, ROUTINE W REFLEX MICROSCOPIC
Bilirubin Urine: NEGATIVE
Glucose, UA: NEGATIVE mg/dL
Hgb urine dipstick: NEGATIVE
Ketones, ur: NEGATIVE mg/dL
Leukocytes,Ua: NEGATIVE
Nitrite: NEGATIVE
Protein, ur: NEGATIVE mg/dL
Specific Gravity, Urine: 1.019 (ref 1.005–1.030)
pH: 6 (ref 5.0–8.0)

## 2023-05-08 NOTE — ED Provider Notes (Signed)
[facility] Provider Note    Event Date/Time   First MD Initiated Contact with Patient 05/08/23 [phone]     (approximate)   History   Abdominal Pain   HPI  Ronald Mueller is a 33 y.o. male with history of hernia repair presents to the ER for evaluation of intermittent bilateral groin pain worsened with movement.  States he does a lot of heavy lifting at work.  Denies any bulging.  No pain radiating to his testicles.  No flank pain.  No fevers or chills.  No nausea or vomiting.  Occasional loose stools.     Physical Exam   Triage Vital Signs: ED Triage Vitals [05/08/23 0901]  Encounter Vitals Group     BP      Systolic BP Percentile      Diastolic BP Percentile      Pulse      Resp      Temp      Temp src      SpO2      Weight 160 lb (72.6 kg)     Height 5\' 3"  (1.6 m)     Head Circumference      Peak Flow      Pain Score 2     Pain Loc      Pain Education      Exclude from Growth Chart     Most recent vital signs: Vitals:   05/08/23 1053  BP: 112/78  Pulse: 74  Resp: 18  Temp: 97.9 F (36.6 C)  SpO2: 100%     Constitutional: Alert  Eyes: Conjunctivae are normal.  Head: Atraumatic. Nose: No congestion/rhinnorhea. Mouth/Throat: Mucous membranes are moist.   Neck: Painless ROM.  Cardiovascular:   Good peripheral circulation. Respiratory: Normal respiratory effort.  No retractions.  Gastrointestinal: Soft and nontender in all 4 quadrants.  No appreciable hernia.  No guarding or rebound. Musculoskeletal:  no deformity Neurologic:  MAE spontaneously. No gross focal neurologic deficits are appreciated.  Skin:  Skin is warm, dry and intact. No rash noted. Psychiatric: Mood and affect are normal. Speech and behavior are normal.    ED Results / Procedures / Treatments   Labs (all labs ordered are listed, but only abnormal results are displayed) Labs Reviewed  CBC WITH DIFFERENTIAL/PLATELET - Abnormal; Notable for the following  components:      Result Value   Abs Immature Granulocytes 0.22 (*)    All other components within normal limits  COMPREHENSIVE METABOLIC PANEL - Abnormal; Notable for the following components:   Glucose, Bld 100 (*)    Calcium 8.7 (*)    All other components within normal limits  URINALYSIS, ROUTINE W REFLEX MICROSCOPIC - Abnormal; Notable for the following components:   Color, Urine YELLOW (*)    APPearance CLEAR (*)    All other components within normal limits     EKG     RADIOLOGY    PROCEDURES:  Critical Care performed:   Procedures   MEDICATIONS ORDERED IN ED: Medications - No data to display   IMPRESSION / MDM / ASSESSMENT AND PLAN / ED COURSE  I reviewed the triage vital signs and the nursing notes.                              Differential diagnosis includes, but is not limited to, hernia, appendicitis, colitis, UTI, stone, ligamentous injury, musculoskeletal strain  Patient presenting to the  ER for evaluation of symptoms as described above.  Based on symptoms, risk factors and considered above differential, this presenting complaint could reflect a potentially life-threatening illness therefore the patient will be placed on continuous pulse oximetry and telemetry for monitoring.  Laboratory evaluation will be sent to evaluate for the above complaints.  Patient clinically very well-appearing however in no acute distress.  His abdominal exam is soft and benign.  No leukocytosis no fever no tachycardia.  Urinalysis without evidence of infection.  Do not feel that diagnostic imaging clinically indicated with his current presentation on emergent basis.  Does appear appropriate for conservative management and follow-up as an outpatient.       FINAL CLINICAL IMPRESSION(S) / ED DIAGNOSES   Final diagnoses:  Groin pain, unspecified laterality     Rx / DC Orders   ED Discharge Orders     None        Note:  This document was prepared using Dragon voice  recognition software and may include unintentional dictation errors.    Willy Eddy, MD 05/08/23 1106

## 2023-05-08 NOTE — ED Triage Notes (Signed)
Pt via POV from home. Pt c/o RLQ pain for the past month, pain is intermittent. Denies NVD. Denies urinary symptoms. Pt has a hx of inguinal hernia repair last year. Pt is A&Ox4 and NAD, ambulatory to triage.

## 2023-05-17 ENCOUNTER — Other Ambulatory Visit: Payer: Self-pay

## 2023-05-17 ENCOUNTER — Emergency Department: Payer: Medicaid Other

## 2023-05-17 ENCOUNTER — Emergency Department
Admission: EM | Admit: 2023-05-17 | Discharge: 2023-05-17 | Disposition: A | Discharge status: Home / Self Care | Payer: Medicaid Other | Attending: Emergency Medicine | Admitting: Emergency Medicine

## 2023-05-17 DIAGNOSIS — N50812 Left testicular pain: Secondary | ICD-10-CM | POA: Diagnosis not present

## 2023-05-17 DIAGNOSIS — Z202 Contact with and (suspected) exposure to infections with a predominantly sexual mode of transmission: Secondary | ICD-10-CM | POA: Diagnosis not present

## 2023-05-17 DIAGNOSIS — R109 Unspecified abdominal pain: Secondary | ICD-10-CM | POA: Diagnosis present

## 2023-05-17 DIAGNOSIS — R1031 Right lower quadrant pain: Secondary | ICD-10-CM

## 2023-05-17 DIAGNOSIS — N5082 Scrotal pain: Secondary | ICD-10-CM

## 2023-05-17 LAB — CHLAMYDIA/NGC RT PCR (ARMC ONLY)
Chlamydia Tr: NOT DETECTED
N gonorrhoeae: NOT DETECTED

## 2023-05-17 MED ORDER — DOXYCYCLINE HYCLATE 100 MG PO TABS: 100.0000 mg | ORAL_TABLET | Freq: Two times a day (BID) | ORAL | 0 refills | Status: AC

## 2023-05-17 MED ORDER — CEFTRIAXONE SODIUM 1 G IJ SOLR
500.0000 mg | Freq: Once | INTRAMUSCULAR | Status: AC
  Administered 2023-05-17: 500 mg via INTRAMUSCULAR
  Filled 2023-05-17: qty 10

## 2023-05-17 MED ORDER — DOXYCYCLINE HYCLATE 100 MG PO TABS
100.0000 mg | ORAL_TABLET | Freq: Once | ORAL | Status: AC
Start: 2023-05-17 — End: 2023-05-17
  Administered 2023-05-17: 100 mg via ORAL
  Filled 2023-05-17: qty 1

## 2023-05-17 NOTE — ED Notes (Signed)
 Patient in [place] at this time

## 2023-05-17 NOTE — Discharge Instructions (Signed)
Take antibiotics as directed.  Follow-up with primary for further evaluation.  You should also schedule follow-up appointment with your urologist.  Return to the emergency department for new or worse symptoms including fevers chills and worsening pain.

## 2023-05-17 NOTE — ED Provider Notes (Signed)
[place] EMERGENCY DEPARTMENT AT [facility] Provider Note   CSN: [id number] Arrival date & time: 05/17/23  1500     History  Chief Complaint  Patient presents with   Groin Pain    Ronald Mueller is a 33 y.o. male.  Patient here for groin pain he notes about a month of bilateral left greater than right testicular pain denies any specific swelling.  No injury.  No fevers or chills.  He does not suspect STD but did have an STI 1 year ago he has been married for 10 years.  No fevers or chills.   Groin Pain Pertinent negatives include no abdominal pain and no shortness of breath.       Home Medications Prior to Admission medications   Medication Sig Start Date End Date Taking? Authorizing Provider  doxycycline (VIBRA-TABS) 100 MG tablet Take 1 tablet (100 mg total) by mouth 2 (two) times daily for 7 days. 05/17/23 05/24/23 Yes Christen Bame, PA-C      Allergies    Patient has no known allergies.    Review of Systems   Review of Systems  Constitutional:  Negative for chills and fever.  Respiratory:  Negative for shortness of breath.   Gastrointestinal:  Negative for abdominal pain.  Genitourinary:  Positive for testicular pain. Negative for penile pain.  Skin:  Negative for wound.  Neurological:  Negative for seizures, weakness and numbness.    Physical Exam Updated Vital Signs BP (!) 151/70   Pulse 69   Temp 98 F (36.7 C)   Resp 18   Ht 5\' 3"  (1.6 m)   Wt 72.6 kg   SpO2 100%   BMI 28.34 kg/m  Physical Exam Vitals and nursing note reviewed.  Constitutional:      General: He is not in acute distress.    Appearance: He is well-developed.  HENT:     Head: Normocephalic and atraumatic.  Eyes:     Conjunctiva/sclera: Conjunctivae normal.  Cardiovascular:     Rate and Rhythm: Normal rate and regular rhythm.  Pulmonary:     Effort: Pulmonary effort is normal.     Breath sounds: Normal breath sounds.  Abdominal:     Palpations: Abdomen is soft.   Genitourinary:    Comments: GU exam with RN abby bedside chaperone.  No masses no rashes.  Mild left epididymal tenderness.  Negative Prehn sign Musculoskeletal:        General: No swelling.     Cervical back: Neck supple.  Skin:    General: Skin is warm and dry.     Capillary Refill: Capillary refill takes less than 2 seconds.  Neurological:     Mental Status: He is alert.  Psychiatric:        Mood and Affect: Mood normal.     ED Results / Procedures / Treatments   Labs (all labs ordered are listed, but only abnormal results are displayed) Labs Reviewed  CHLAMYDIA/NGC RT PCR [facility] ONLY)              EKG None  Radiology US SCROTUM W/DOPPLER Result Date: 05/17/2023 CLINICAL DATA:  Right scrotal pain for 1 month. EXAM: SCROTAL ULTRASOUND DOPPLER ULTRASOUND OF THE TESTICLES TECHNIQUE: Complete ultrasound examination of the testicles, epididymis, and other scrotal structures was performed. Color and spectral Doppler ultrasound were also utilized to evaluate blood flow to the testicles. COMPARISON:  Aug 01, 2022. FINDINGS: Right testicle Measurements: 4.6 x 2.9 x 2.3 cm. No mass or microlithiasis visualized.  Left testicle Measurements: 4.6 x 2.3 x 2.8 cm. No mass or microlithiasis visualized. Right epididymis:  Normal in size and appearance. Left epididymis:  Normal in size and appearance. Hydrocele:  None visualized. Varicocele:  None visualized. Pulsed Doppler interrogation of both testes demonstrates normal low resistance arterial and venous waveforms bilaterally. Mildly enlarged bilateral inguinal lymph nodes are noted with the largest measuring 8 mm in minor axis. These most likely are reactive or inflammatory in etiology. IMPRESSION: No evidence of testicular mass or torsion. Electronically Signed   By: Lupita Raider M.D.   On: 05/17/2023 17:24    Procedures Procedures    Medications Ordered in ED Medications  cefTRIAXone (ROCEPHIN) injection 500 mg (has no administration in  time range)  doxycycline (VIBRA-TABS) tablet 100 mg (has no administration in time range)    ED Course/ Medical Decision Making/ A&P                                 Medical Decision Making Patient here for testicular pain he does have tenderness atraumatic been going on for about a month.  Afebrile nontachycardic.  I have a low suspicion of torsion however will obtain ultrasound. Patient denies acute concern for STIs he has been married for 10 years but did have a STD 1 year ago he is with the same partner.  Will send off gonorrhea chlamydia.  Ultrasound shows no torsion.  I discussed findings with patient and conducted shared decision making we plan to treat empirically for potential STD he can take antibiotics sent to pharmacy.  He will follow-up with his primary care urologist.   Risk Prescription drug management.           Final Clinical Impression(s) / ED Diagnoses Final diagnoses:  Pain in left testicle  Potential exposure to STD    Rx / DC Orders ED Discharge Orders          Ordered    doxycycline (VIBRA-TABS) 100 MG tablet  2 times daily        05/17/23 1741              Christen Bame, [place] 05/17/23 1742    Jene Every, MD 05/17/23 [phone]

## 2023-05-17 NOTE — ED Triage Notes (Signed)
Pt comes with groin pain for about month. Pt states it is both sides. Pt denies any known injuries

## 2023-08-26 ENCOUNTER — Other Ambulatory Visit: Payer: Self-pay

## 2023-08-26 ENCOUNTER — Encounter: Payer: Self-pay | Admitting: *Deleted

## 2023-08-26 DIAGNOSIS — R1013 Epigastric pain: Secondary | ICD-10-CM | POA: Diagnosis present

## 2023-08-26 DIAGNOSIS — K8070 Calculus of gallbladder and bile duct without cholecystitis without obstruction: Secondary | ICD-10-CM | POA: Diagnosis not present

## 2023-08-26 LAB — CBC
HCT: 44.7 % (ref 39.0–52.0)
Hemoglobin: 15.5 g/dL (ref 13.0–17.0)
MCH: 30.5 pg (ref 26.0–34.0)
MCHC: 34.7 g/dL (ref 30.0–36.0)
MCV: 87.8 fL (ref 80.0–100.0)
Platelets: 224 10*3/uL (ref 150–400)
RBC: 5.09 MIL/uL (ref 4.22–5.81)
RDW: 12.9 % (ref 11.5–15.5)
WBC: 6.9 10*3/uL (ref 4.0–10.5)
nRBC: 0 % (ref 0.0–0.2)

## 2023-08-26 LAB — COMPREHENSIVE METABOLIC PANEL WITH GFR
ALT: 26 U/L (ref 0–44)
AST: 19 U/L (ref 15–41)
Albumin: 4.5 g/dL (ref 3.5–5.0)
Alkaline Phosphatase: 38 U/L (ref 38–126)
Anion gap: 10 (ref 5–15)
BUN: 10 mg/dL (ref 6–20)
CO2: 27 mmol/L (ref 22–32)
Calcium: 8.7 mg/dL — ABNORMAL LOW (ref 8.9–10.3)
Chloride: 102 mmol/L (ref 98–111)
Creatinine, Ser: 0.97 mg/dL (ref 0.61–1.24)
GFR, Estimated: >60 mL/min (ref >60)
Glucose, Bld: 111 mg/dL — ABNORMAL HIGH (ref 70–99)
Potassium: 3.4 mmol/L — ABNORMAL LOW (ref 3.5–5.1)
Sodium: 139 mmol/L (ref 135–145)
Total Bilirubin: 0.6 mg/dL (ref 0.0–1.2)
Total Protein: 7 g/dL (ref 6.5–8.1)

## 2023-08-26 LAB — URINALYSIS, ROUTINE W REFLEX MICROSCOPIC
Bilirubin Urine: NEGATIVE
Glucose, UA: NEGATIVE mg/dL
Hgb urine dipstick: NEGATIVE
Ketones, ur: NEGATIVE mg/dL
Leukocytes,Ua: NEGATIVE
Nitrite: NEGATIVE
Protein, ur: NEGATIVE mg/dL
Specific Gravity, Urine: 1.013 (ref 1.005–1.030)
pH: 6 (ref 5.0–8.0)

## 2023-08-26 LAB — LIPASE, BLOOD: Lipase: 27 U/L (ref 11–51)

## 2023-08-26 NOTE — ED Triage Notes (Signed)
 Pt ambulatory to triage.  Pt has abd pain for 20 minutes.  Pt reports nausea.  No vomiting or diarrhea. No back pain.  Pt alert  speech clear.

## 2023-08-27 ENCOUNTER — Emergency Department

## 2023-08-27 ENCOUNTER — Emergency Department
Admission: EM | Admit: 2023-08-27 | Discharge: 2023-08-27 | Disposition: A | Discharge status: Home / Self Care | Attending: Emergency Medicine | Admitting: Emergency Medicine

## 2023-08-27 DIAGNOSIS — K8020 Calculus of gallbladder without cholecystitis without obstruction: Secondary | ICD-10-CM

## 2023-08-27 DIAGNOSIS — K8050 Calculus of bile duct without cholangitis or cholecystitis without obstruction: Secondary | ICD-10-CM

## 2023-08-27 NOTE — ED Provider Notes (Signed)
 [facility] Provider Note    Event Date/Time   First MD Initiated Contact with Patient 08/27/23 0201     (approximate)   History   Abdominal Pain   HPI Ronald Mueller is a 33 y.o. 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  He said that it happens to him sometimes and he even recently had an MRI of his chest to evaluate a neurofibroma that is right in the area of the pain he is experiencing.  The MRI identified a relatively small lesion.  He said that the pain usually goes away but tonight it came on about 20 minutes after he ate dinner and has been persistent.  It is a dull aching pain that is not too bad and it went away while he was in the waiting room but then it came back.  It is not pain up in his chest.  He is having no shortness of breath.  He denies nausea, vomiting, lower abdominal pain, and diarrhea.  Nothing in particular makes it better or worse.  No recent trauma.  He said that he does drink alcohol and had 2 beers today, but he does not drink to excess.  He is unaware of any history of acid reflux and does not take any medication for it.     Physical Exam   Triage Vital Signs: ED Triage Vitals  Encounter Vitals Group     BP 08/26/23 2258 138/78     Systolic BP Percentile --      Diastolic BP Percentile --      Pulse Rate 08/26/23 2258 74     Resp 08/26/23 2258 20     Temp 08/26/23 2258 (!) 97 F (36.1 C)     Temp Source 08/26/23 2258 Oral     SpO2 08/26/23 2258 99 %     Weight 08/26/23 2257 72.6 kg (160 lb)     Height 08/26/23 2257 1.6 m (5\' 3" )     Head Circumference --      Peak Flow --      Pain Score 08/26/23 2257 8     Pain Loc --      Pain Education --      Exclude from Growth Chart --     Most recent vital signs: Vitals:   08/27/23 0237 08/27/23 0441  BP: 137/81 115/80  Pulse: 71 72  Resp: 16 18  Temp:  98.3 F (36.8 C)  SpO2: 98% 99%     General: Awake, no obvious distress.  Ambulatory without difficulty. CV:  Good peripheral perfusion.  Regular rate and rhythm. Resp:  Normal effort. Speaking easily and comfortably, no accessory muscle usage nor intercostal retractions.   Abd:  No distention.  Tender to palpation in the epigastrium, equivocal tenderness in the right upper quadrant but negative Murphy sign.  No lower abdominal tenderness and no guarding. Other:  No externally visible neurofibromatosis lesions on his abdomen   ED Results / Procedures / Treatments   Labs (all labs ordered are listed, but only abnormal results are displayed) Labs Reviewed  COMPREHENSIVE METABOLIC PANEL WITH GFR - Abnormal; Notable for the following components:      Result Value   Potassium 3.4 (*)    Glucose, Bld 111 (*)    Calcium 8.7 (*)    All other components within normal limits  URINALYSIS, ROUTINE W REFLEX MICROSCOPIC - Abnormal; Notable for the following components:  Color, Urine YELLOW (*)    APPearance CLEAR (*)    All other components within normal limits  LIPASE, BLOOD  CBC     RADIOLOGY See ED course for details   PROCEDURES:  Critical Care performed: No  Procedures    IMPRESSION / MDM / ASSESSMENT AND PLAN / ED COURSE  I reviewed the triage vital signs and the nursing notes.                              Differential diagnosis includes, but is not limited to, biliary colic, pancreatitis, acid reflux, pain from his neurofibroma.  Patient's presentation is most consistent with acute presentation with potential threat to life or bodily function.  Labs/studies ordered: CMP, lipase, CBC, urinalysis, abdominal ultrasound  Interventions/Medications given:  Medications - No data to display  (Note:  hospital course my include additional interventions and/or labs/studies not listed above.)   All of the patient's lab work is reassuring and within normal limits and his vital signs are also stable and  within normal limits.  He showed me the report in epic from his MRI chest confirming a neurofibroma in the area of question.  However it is relatively small and I wonder if perhaps he has gallstones which is explaining why he has intermittent/episodic pain in that at least tonight seem to occur after he ate.  He declined any pain medication at this time but we will proceed with ultrasound I will reassess at that point.     Clinical Course as of 08/27/23 0506  Tue Aug 27, 2023  0503 US  ABDOMEN LIMITED RUQ (LIVER/GB) I independently viewed and interpreted the patient's ultrasound and I can see a gallstone but no clear evidence of cholecystitis.  The radiologist confirmed a large gallstone that seem to be stuck in the neck of the gallbladder any question the possibility of acute cholecystitis and recommended correlation with labs.  As previously described, the patient's labs (specifically his LFTs and lipase) are all within normal limits.  I reassessed him and he said he feels much better.  He now has no tenderness to palpation of the epigastrium nor right upper quadrant.  There is no indication for surgical intervention at this time.  I had my usual and customary gallbladder/biliary colic discussion with the patient.  We discussed the need for outpatient follow-up with surgery and he said he understands and agrees.  I gave my usual and customary follow-up recommendations and return precautions. [CF]    Clinical Course User Index [CF] Lynnda Sas, MD     FINAL CLINICAL IMPRESSION(S) / ED DIAGNOSES   Final diagnoses:  Biliary colic  Gallstones     Rx / DC Orders   ED Discharge Orders     None        Note:  This document was prepared using Dragon voice recognition software and may include unintentional dictation errors.   Lynnda Sas, MD 08/27/23 [phone]

## 2023-08-27 NOTE — Discharge Instructions (Signed)
You have been seen in the Emergency Department (ED) for abdominal pain.  Your evaluation suggests that your pain is caused by gallstones.  Fortunately you do not need immediate surgery at this time, but it is important that you follow up with a surgeon as an outpatient; typically surgical removal of the gallbladder is the only thing that will definitively fix your issue.  Read through the included information about a bland diet, and use any prescribed medications as instructed.  Avoid smoking and alcohol use.  Please follow up as instructed above regarding today's emergent visit and the symptoms that are bothering you.  Return to the ED if your abdominal pain worsens or fails to improve, you develop bloody vomiting, bloody diarrhea, you are unable to tolerate fluids due to vomiting, fever greater than 101, or other symptoms that concern you. 

## 2023-08-27 NOTE — ED Notes (Signed)
 Pt stated this abdominal pain had been on and off for a year but the episode he had tonight lasted longer than normal.

## 2023-08-29 ENCOUNTER — Encounter: Payer: Self-pay | Admitting: General Surgery

## 2023-08-29 ENCOUNTER — Ambulatory Visit: Payer: Self-pay | Admitting: General Surgery

## 2023-08-29 ENCOUNTER — Telehealth: Payer: Self-pay | Admitting: General Surgery

## 2023-08-29 ENCOUNTER — Ambulatory Visit: Admitting: General Surgery

## 2023-08-29 VITALS — BP 117/76 | HR 73 | Temp 98.4°F | Ht 63.0 in | Wt 160.8 lb

## 2023-08-29 DIAGNOSIS — K8050 Calculus of bile duct without cholangitis or cholecystitis without obstruction: Secondary | ICD-10-CM

## 2023-08-29 NOTE — Progress Notes (Signed)
 Patient ID: Ronald Mueller, male   DOB: 1991-01-17, 33 y.o.   MRN: [id number] CC: Biliary Colic History of Present Illness Ronald Mueller is a 33 y.o. male with past medical history significant for neurofibromatosis who presents to consultation for biliary colic.  The patient reports that over the last several months he has developed epigastric pain.  He reports that this is usually after he eats.  He says that this is sometimes associated with some nausea but he has not ever had any episodes of vomiting.  He reports that last week the pain progressed so much that he went to the emergency department for evaluation.  There he was found to have a distended gallbladder with a 1.6 cm stone at the neck of the gallbladder.  He says that his pain abated and he was discharged.  He denies any pain episodes since and denies any fevers or chills.  He is tolerating a diet and having normal bowel function.  He has a history of inguinal hernia repair done robotically.  Past Medical History Past Medical History:  Diagnosis Date   H/O neurofibromatosis [facility])        Past Surgical History:  Procedure Laterality Date   HERNIA REPAIR      No Known Allergies  No current outpatient medications on file.   No current facility-administered medications for this visit.    Family History History reviewed. No pertinent family history.     Social History Social History   Tobacco Use   Smoking status: Every Day    Types: Cigarettes    Passive exposure: Never   Smokeless tobacco: Never  Vaping Use   Vaping status: Every Day   Substances: Nicotine, Flavoring  Substance Use Topics   Alcohol use: Yes    Alcohol/week: 1.0 standard drink of alcohol    Types: 1 Cans of beer per week    Comment: weekly   Drug use: No        ROS Full ROS of systems performed and is otherwise negative there than what is stated in the HPI  Physical Exam Blood pressure 117/76, pulse 73, temperature 98.4 F (36.9 C),  temperature source Oral, height 5\' 3"  (1.6 m), weight 160 lb 12.8 oz (72.9 kg), SpO2 98%.  Alert and oriented x 3, normal work of breathing on room air, regular rate and rhythm, abdomen is soft, nontender and nondistended, robotic port sites have healed well.  No rebound tenderness or Murphy sign.  Data Reviewed Labs reviewed and significant for a normal T bilirubin.  I personally reviewed his ultrasound and it looks like there is a large stone within the gallbladder lumen.  There is no pericholecystic fluid or thickening gallbladder wall. I have personally reviewed the patient's imaging and medical records.    Assessment/plan    33 year old with history of neurofibromatosis who presents in consultation for biliary colic.  I discussed surgical intervention and the risks, benefits alternatives of the procedure including risk of infection, bleeding, injury to the common bile duct, retained stone, bile leak and need for open procedure he understands these risks and wishes to proceed with surgery.  We will use ICG.     Barrett Lick 08/29/2023, 2:29 PM

## 2023-08-29 NOTE — Patient Instructions (Signed)
 You have requested to have your gallbladder removed. This will be done at [facility] with Dr. Maurine Minister.  If you are on any injectable weight loss medication, you will need to stop taking your GLP-1 injectable (weight loss) medications 8 days before your surgery to avoid any complications with anesthesia.   You will most likely be out of work 1-2 weeks for this surgery.  If you have FMLA or disability paperwork that needs filled out you may drop this off at our office or this can be faxed to [phone].  You will return after your post-op appointment with a lifting restriction for approximately 4 more weeks.  You will be able to eat anything you would like to following surgery. But, start by eating a bland diet and advance this as tolerated. The Gallbladder diet is below, please go as closely by this diet as possible prior to surgery to avoid any further attacks.  Please see the (blue)pre-care form that you have been given today. Our surgery scheduler will call you to verify surgery date and to go over information.   If you have any questions, please call our office.  Laparoscopic Cholecystectomy Laparoscopic cholecystectomy is surgery to remove the gallbladder. The gallbladder is located in the upper right part of the abdomen, behind the liver. It is a storage sac for bile, which is produced in the liver. Bile aids in the digestion and absorption of fats. Cholecystectomy is often done for inflammation of the gallbladder (cholecystitis). This condition is usually caused by a buildup of gallstones (cholelithiasis) in the gallbladder. Gallstones can block the flow of bile, and that can result in inflammation and pain. In severe cases, emergency surgery may be required. If emergency surgery is not required, you will have time to prepare for the procedure. Laparoscopic surgery is an alternative to open surgery. Laparoscopic surgery has a shorter recovery time. Your common bile duct may also need  to be examined during the procedure. If stones are found in the common bile duct, they may be removed. LET [facility] CARE PROVIDER KNOW ABOUT: Any allergies you have. All medicines you are taking, including vitamins, herbs, eye drops, creams, and over-the-counter medicines. Previous problems you or members of your family have had with the use of anesthetics. Any blood disorders you have. Previous surgeries you have had.  Any medical conditions you have. RISKS AND COMPLICATIONS Generally, this is a safe procedure. However, problems may occur, including: Infection. Bleeding. Allergic reactions to medicines. Damage to other structures or organs. A stone remaining in the common bile duct. A bile leak from the cyst duct that is clipped when your gallbladder is removed. The need to convert to open surgery, which requires a larger incision in the abdomen. This may be necessary if your surgeon thinks that it is not safe to continue with a laparoscopic procedure. BEFORE THE PROCEDURE Ask your health care provider about: Changing or stopping your regular medicines. This is especially important if you are taking diabetes medicines or blood thinners. Taking medicines such as aspirin and ibuprofen. These medicines can thin your blood. Do not take these medicines before your procedure if your health care provider instructs you not to. Follow instructions from your health care provider about eating or drinking restrictions. Let your health care provider know if you develop a cold or an infection before surgery. Plan to have someone take you home after the procedure. Ask your health care provider how your surgical site will be marked or identified. You may  be given antibiotic medicine to help prevent infection. PROCEDURE To reduce your risk of infection: Your health care team will wash or sanitize their hands. Your skin will be washed with soap. An IV tube may be inserted into one of your  veins. You will be given a medicine to make you fall asleep (general anesthetic). A breathing tube will be placed in your mouth. The surgeon will make several small cuts (incisions) in your abdomen. A thin, lighted tube (laparoscope) that has a tiny camera on the end will be inserted through one of the small incisions. The camera on the laparoscope will send a picture to a TV screen (monitor) in the operating room. This will give the surgeon a good view inside your abdomen. A gas will be pumped into your abdomen. This will expand your abdomen to give the surgeon more room to perform the surgery. Other tools that are needed for the procedure will be inserted through the other incisions. The gallbladder will be removed through one of the incisions. After your gallbladder has been removed, the incisions will be closed with stitches (sutures), staples, or skin glue. Your incisions may be covered with a bandage (dressing). The procedure may vary among health care providers and hospitals. AFTER THE PROCEDURE Your blood pressure, heart rate, breathing rate, and blood oxygen level will be monitored often until the medicines you were given have worn off. You will be given medicines as needed to control your pain.   This information is not intended to replace advice given to you by your health care provider. Make sure you discuss any questions you have with your health care provider.   Document Released: 03/12/2005 Document Revised: 12/01/2014 Document Reviewed: 10/22/2012 Elsevier Interactive Patient Education 2016 Else[employer] Inc.   Low-Fat Diet for Gallbladder Conditions A low-fat diet can be helpful if you have pancreatitis or a gallbladder condition. With these conditions, your pancreas and gallbladder have trouble digesting fats. A healthy eating plan with less fat will help rest your pancreas and gallbladder and reduce your symptoms. WHAT DO I NEED TO KNOW ABOUT THIS DIET? Eat a low-fat  diet. Reduce your fat intake to less than 20-30% of your total daily calories. This is less than 50-60 g of fat per day. Remember that you need some fat in your diet. Ask your dietician what your daily goal should be. Choose nonfat and low-fat healthy foods. Look for the words "nonfat," "low fat," or "fat free." As a guide, look on the label and choose foods with less than 3 g of fat per serving. Eat only one serving. Avoid alcohol. Do not smoke. If you need help quitting, talk with your health care provider. Eat small frequent meals instead of three large heavy meals. WHAT FOODS CAN I EAT? Grains Include healthy grains and starches such as potatoes, wheat bread, fiber-rich cereal, and brown rice. Choose whole grain options whenever possible. In adults, whole grains should account for 45-65% of your daily calories.  Fruits and Vegetables Eat plenty of fruits and vegetables. Fresh fruits and vegetables add fiber to your diet. Meats and Other Protein Sources Eat lean meat such as chicken and pork. Trim any fat off of meat before cooking it. Eggs, fish, and beans are other sources of protein. In adults, these foods should account for 10-35% of your daily calories. Dairy Choose low-fat milk and dairy options. Dairy includes fat and protein, as well as calcium.  Fats and Oils Limit high-fat foods such as fried foods, sweets, baked  goods, sugary drinks.  Other Creamy sauces and condiments, such as mayonnaise, can add extra fat. Think about whether or not you need to use them, or use smaller amounts or low fat options. WHAT FOODS ARE NOT RECOMMENDED? High fat foods, such as: [employer]. Ice cream. [place] toast. Sweet rolls. Pizza. Cheese bread. Foods covered with batter, butter, creamy sauces, or cheese. Fried foods. Sugary drinks and desserts. Foods that cause gas or bloating   This information is not intended to replace advice given to you by your health care provider. Make sure you  discuss any questions you have with your health care provider.   Document Released: 03/17/2013 Document Reviewed: 03/17/2013 Elsevier Interactive Patient Education [employer]

## 2023-08-29 NOTE — Telephone Encounter (Signed)
 Patient has been advised of Pre-Admission date/time, and Surgery date at [facility].  Surgery Date: 09/02/23 Preadmission Testing Date: Preadmissions to call patient.    Patient informed of the scheduling process and surgery information given at time of office visit.  Patient has been made aware to call [phone], between 1-3:00pm the day before surgery, to find out what time to arrive for surgery.

## 2023-08-30 ENCOUNTER — Other Ambulatory Visit: Payer: Self-pay

## 2023-08-30 ENCOUNTER — Encounter
Admission: RE | Admit: 2023-08-30 | Discharge: 2023-08-30 | Disposition: A | Discharge status: Home / Self Care | Source: Ambulatory Visit | Attending: General Surgery | Admitting: General Surgery

## 2023-08-30 HISTORY — DX: Calculus of bile duct without cholangitis or cholecystitis without obstruction: K80.50

## 2023-08-30 NOTE — Patient Instructions (Addendum)
 Your procedure is scheduled on: 09/02/23  Report to the Registration Desk on the 1st floor of the [facility]. To find out your arrival time, please call [phone] between 1PM - 3PM on: 08/30/23 If your arrival time is 6:00 am, do not arrive before that time as the [facility] entrance doors do not open until 6:00 am.  REMEMBER: Instructions that are not followed completely may result in serious medical risk, up to and including death; or upon the discretion of your surgeon and anesthesiologist your surgery may need to be rescheduled.  Do not eat food or drink any liquids after midnight the night before surgery.  No gum chewing or hard candies.   One week prior to surgery: Stop Anti-inflammatories (NSAIDS) such as Advil, Aleve, Ibuprofen, Motrin, Naproxen, Naprosyn and Aspirin based products such as Excedrin, Goody's Powder, BC Powder. You may take Tylenol  if needed for pain up until the day of surgery.   Stop ANY OVER THE COUNTER supplements until after surgery.   ON THE DAY OF SURGERY ONLY TAKE THESE MEDICATIONS WITH SIPS OF WATER:  none  No Alcohol for 24 hours before or after surgery.  No Smoking including e-cigarettes for 24 hours before surgery.  No chewable tobacco products for at least 6 hours before surgery.  No nicotine patches on the day of surgery.  Do not use any "recreational" drugs for at least a week (preferably 2 weeks) before your surgery.  Please be advised that the combination of cocaine and anesthesia may have negative outcomes, up to and including death. If you test positive for cocaine, your surgery will be cancelled.  On the morning of surgery brush your teeth with toothpaste and water, you may rinse your mouth with mouthwash if you wish. Do not swallow any toothpaste or mouthwash.   Do not wear jewelry, make-up, hairpins, clips or nail polish.  For welded (permanent) jewelry: bracelets, anklets, waist bands, etc.  Please have this removed prior  to surgery.  If it is not removed, there is a chance that hospital personnel will need to cut it off on the day of surgery.  Do not wear lotions, powders, or perfumes.   Do not shave body hair from the neck down 48 hours before surgery.  Contact lenses, hearing aids and dentures may not be worn into surgery.  Do not bring valuables to the hospital. [facility] is not responsible for any missing/lost belongings or valuables.   Notify your doctor if there is any change in your medical condition (cold, fever, infection).  Wear comfortable clothing (specific to your surgery type) to the hospital.  After surgery, you can help prevent lung complications by doing breathing exercises.  Take deep breaths and cough every 1-2 hours. Your doctor may order a device called an Incentive Spirometer to help you take deep breaths.  When coughing or sneezing, hold a pillow firmly against your incision with both hands. This is called "splinting." Doing this helps protect your incision. It also decreases belly discomfort.  If you are being admitted to the hospital overnight, leave your suitcase in the car. After surgery it may be brought to your room.  In case of increased patient census, it may be necessary for you, the patient, to continue your postoperative care in the Same Day Surgery department.  If you are being discharged the day of surgery, you will not be allowed to drive home. You will need a responsible individual to drive you home and stay with you for  24 hours after surgery.   If you are taking public transportation, you will need to have a responsible individual with you.  Please call the Pre-admissions Testing Dept. at [phone] if you have any questions about these instructions.  Surgery Visitation Policy:  Patients having surgery or a procedure may have two visitors.  Children under the age of 48 must have an adult with them who is not the patient.  Inpatient Visitation:     Visiting hours are 7 a.m. to 8 p.m. Up to four visitors are allowed at one time in a patient room. The visitors may rotate out with other people during the day.  One visitor age 40 or older may stay with the patient overnight and must be in the room by 8 p.m.

## 2023-09-01 ENCOUNTER — Encounter: Payer: Self-pay | Admitting: General Surgery

## 2023-09-01 MED ORDER — SODIUM CHLORIDE 0.9 % IV SOLN
2.0000 g | INTRAVENOUS | Status: AC
  Administered 2023-09-02: 2 g via INTRAVENOUS

## 2023-09-01 MED ORDER — LACTATED RINGERS IV SOLN: INTRAVENOUS | Status: DC

## 2023-09-01 MED ORDER — CHLORHEXIDINE GLUCONATE CLOTH 2 % EX PADS: 6.0000 | MEDICATED_PAD | Freq: Once | CUTANEOUS | Status: DC

## 2023-09-01 MED ORDER — ORAL CARE MOUTH RINSE: 15.0000 mL | Freq: Once | OROMUCOSAL | Status: AC

## 2023-09-01 MED ORDER — CHLORHEXIDINE GLUCONATE 0.12 % MT SOLN
15.0000 mL | Freq: Once | OROMUCOSAL | Status: AC
  Administered 2023-09-02: 15 mL via OROMUCOSAL

## 2023-09-01 MED ORDER — INDOCYANINE GREEN 25 MG IV SOLR
1.2500 mg | Freq: Once | INTRAVENOUS | Status: AC
  Administered 2023-09-02: 1.25 mg via INTRAVENOUS

## 2023-09-02 ENCOUNTER — Ambulatory Visit: Admitting: Anesthesiology

## 2023-09-02 ENCOUNTER — Other Ambulatory Visit: Payer: Self-pay

## 2023-09-02 ENCOUNTER — Encounter: Payer: Self-pay | Admitting: General Surgery

## 2023-09-02 ENCOUNTER — Encounter
Admission: RE | Disposition: A | Discharge status: Home / Self Care | Payer: Self-pay | Source: Home / Self Care | Attending: General Surgery

## 2023-09-02 ENCOUNTER — Telehealth: Payer: Self-pay | Admitting: General Surgery

## 2023-09-02 ENCOUNTER — Ambulatory Visit
Admission: RE | Admit: 2023-09-02 | Discharge: 2023-09-02 | Disposition: A | Discharge status: Home / Self Care | Attending: General Surgery | Admitting: General Surgery

## 2023-09-02 DIAGNOSIS — K8010 Calculus of gallbladder with chronic cholecystitis without obstruction: Secondary | ICD-10-CM | POA: Insufficient documentation

## 2023-09-02 DIAGNOSIS — K8050 Calculus of bile duct without cholangitis or cholecystitis without obstruction: Secondary | ICD-10-CM | POA: Diagnosis present

## 2023-09-02 DIAGNOSIS — Q8500 Neurofibromatosis, unspecified: Secondary | ICD-10-CM | POA: Insufficient documentation

## 2023-09-02 SURGERY — CHOLECYSTECTOMY, ROBOT-ASSISTED, LAPAROSCOPIC
Anesthesia: General

## 2023-09-02 MED ORDER — FENTANYL CITRATE (PF) 100 MCG/2ML IJ SOLN
INTRAMUSCULAR | Status: AC
  Filled 2023-09-02: qty 2

## 2023-09-02 MED ORDER — SUGAMMADEX SODIUM 200 MG/2ML IV SOLN
INTRAVENOUS | Status: DC | PRN
  Administered 2023-09-02: 200 mg via INTRAVENOUS

## 2023-09-02 MED ORDER — KETOROLAC TROMETHAMINE 30 MG/ML IJ SOLN
INTRAMUSCULAR | Status: DC | PRN
  Administered 2023-09-02: 15 mg via INTRAVENOUS

## 2023-09-02 MED ORDER — OXYCODONE HCL 5 MG PO TABS
ORAL_TABLET | ORAL | Status: AC
Start: 2023-09-02 — End: ?
  Filled 2023-09-02: qty 1

## 2023-09-02 MED ORDER — INDOCYANINE GREEN 25 MG IV SOLR
INTRAVENOUS | Status: AC
  Filled 2023-09-02: qty 10

## 2023-09-02 MED ORDER — ACETAMINOPHEN 10 MG/ML IV SOLN
INTRAVENOUS | Status: DC | PRN
  Administered 2023-09-02: 1000 mg via INTRAVENOUS

## 2023-09-02 MED ORDER — DEXAMETHASONE SODIUM PHOSPHATE 10 MG/ML IJ SOLN
INTRAMUSCULAR | Status: DC | PRN
  Administered 2023-09-02: 10 mg via INTRAVENOUS

## 2023-09-02 MED ORDER — ACETAMINOPHEN 10 MG/ML IV SOLN: 1000.0000 mg | Freq: Once | INTRAVENOUS | Status: DC | PRN

## 2023-09-02 MED ORDER — BUPIVACAINE-EPINEPHRINE (PF) 0.5% -1:200000 IJ SOLN
INTRAMUSCULAR | Status: AC
  Filled 2023-09-02: qty 10

## 2023-09-02 MED ORDER — FENTANYL CITRATE (PF) 100 MCG/2ML IJ SOLN
25.0000 ug | INTRAMUSCULAR | Status: DC | PRN
  Administered 2023-09-02 (×2): 50 ug via INTRAVENOUS

## 2023-09-02 MED ORDER — PROPOFOL 10 MG/ML IV BOLUS
INTRAVENOUS | Status: AC
  Filled 2023-09-02: qty 20

## 2023-09-02 MED ORDER — ONDANSETRON HCL 4 MG/2ML IJ SOLN
INTRAMUSCULAR | Status: DC | PRN
  Administered 2023-09-02: 4 mg via INTRAVENOUS

## 2023-09-02 MED ORDER — LACTATED RINGERS IV SOLN: INTRAVENOUS | Status: DC

## 2023-09-02 MED ORDER — MIDAZOLAM HCL 2 MG/2ML IJ SOLN
INTRAMUSCULAR | Status: AC
  Filled 2023-09-02: qty 2

## 2023-09-02 MED ORDER — ACETAMINOPHEN 10 MG/ML IV SOLN
INTRAVENOUS | Status: AC
  Filled 2023-09-02: qty 100

## 2023-09-02 MED ORDER — FENTANYL CITRATE (PF) 100 MCG/2ML IJ SOLN
INTRAMUSCULAR | Status: DC | PRN
  Administered 2023-09-02 (×4): 50 ug via INTRAVENOUS

## 2023-09-02 MED ORDER — BUPIVACAINE LIPOSOME 1.3 % IJ SUSP
INTRAMUSCULAR | Status: DC | PRN
  Administered 2023-09-02: 10 mL

## 2023-09-02 MED ORDER — OXYCODONE HCL 5 MG PO TABS: 5.0000 mg | ORAL_TABLET | Freq: Four times a day (QID) | ORAL | 0 refills | Status: DC | PRN

## 2023-09-02 MED ORDER — MIDAZOLAM HCL 2 MG/2ML IJ SOLN
INTRAMUSCULAR | Status: DC | PRN
  Administered 2023-09-02: 2 mg via INTRAVENOUS

## 2023-09-02 MED ORDER — BUPIVACAINE-EPINEPHRINE (PF) 0.5% -1:200000 IJ SOLN
INTRAMUSCULAR | Status: DC | PRN
  Administered 2023-09-02: 10 mL

## 2023-09-02 MED ORDER — CHLORHEXIDINE GLUCONATE 0.12 % MT SOLN
OROMUCOSAL | Status: AC
  Filled 2023-09-02: qty 15

## 2023-09-02 MED ORDER — PROPOFOL 10 MG/ML IV BOLUS
INTRAVENOUS | Status: DC | PRN
  Administered 2023-09-02: 150 mg via INTRAVENOUS

## 2023-09-02 MED ORDER — LIDOCAINE HCL (CARDIAC) PF 100 MG/5ML IV SOSY
PREFILLED_SYRINGE | INTRAVENOUS | Status: DC | PRN
  Administered 2023-09-02: 100 mg via INTRAVENOUS

## 2023-09-02 MED ORDER — ONDANSETRON HCL 4 MG/2ML IJ SOLN: 4.0000 mg | Freq: Once | INTRAMUSCULAR | Status: DC | PRN

## 2023-09-02 MED ORDER — ROCURONIUM BROMIDE 100 MG/10ML IV SOLN
INTRAVENOUS | Status: DC | PRN
  Administered 2023-09-02: 40 mg via INTRAVENOUS
  Administered 2023-09-02: 10 mg via INTRAVENOUS
  Administered 2023-09-02: 20 mg via INTRAVENOUS

## 2023-09-02 MED ORDER — SODIUM CHLORIDE 0.9 % IV SOLN
INTRAVENOUS | Status: AC
  Filled 2023-09-02: qty 2

## 2023-09-02 MED ORDER — OXYCODONE HCL 5 MG/5ML PO SOLN: 5.0000 mg | Freq: Once | ORAL | Status: AC | PRN

## 2023-09-02 MED ORDER — OXYCODONE HCL 5 MG PO TABS
5.0000 mg | ORAL_TABLET | Freq: Once | ORAL | Status: AC | PRN
  Administered 2023-09-02: 5 mg via ORAL

## 2023-09-02 MED ORDER — BUPIVACAINE LIPOSOME 1.3 % IJ SUSP
INTRAMUSCULAR | Status: AC
Start: 2023-09-02 — End: ?
  Filled 2023-09-02: qty 10

## 2023-09-02 MED ORDER — DEXMEDETOMIDINE HCL IN NACL 80 MCG/20ML IV SOLN
INTRAVENOUS | Status: DC | PRN
  Administered 2023-09-02: 8 ug via INTRAVENOUS
  Administered 2023-09-02: 4 ug via INTRAVENOUS
  Administered 2023-09-02: 8 ug via INTRAVENOUS

## 2023-09-02 SURGICAL SUPPLY — 38 items
BAG PRESSURE INF REUSE 1000 (BAG) IMPLANT
CANNULA REDUCER 12-8 DVNC XI (CANNULA) ×2 IMPLANT
CAUTERY HOOK MNPLR 1.6 DVNC XI (INSTRUMENTS) ×2 IMPLANT
CLIP LIGATING HEMO O LOK GREEN (MISCELLANEOUS) ×2 IMPLANT
DERMABOND ADVANCED .7 DNX12 (GAUZE/BANDAGES/DRESSINGS) ×2 IMPLANT
DRAPE ARM DVNC X/XI (DISPOSABLE) ×8 IMPLANT
DRAPE COLUMN DVNC XI (DISPOSABLE) ×2 IMPLANT
ELECTRODE REM PT RTRN 9FT ADLT (ELECTROSURGICAL) ×2 IMPLANT
FORCEPS BPLR 8 MD DVNC XI (FORCEP) IMPLANT
FORCEPS BPLR R/ABLATION 8 DVNC (INSTRUMENTS) ×2 IMPLANT
FORCEPS PROGRASP DVNC XI (FORCEP) ×2 IMPLANT
GLOVE BIOGEL PI IND STRL 7.5 (GLOVE) ×4 IMPLANT
GLOVE SURG SYN 7.0 (GLOVE) ×6 IMPLANT
GLOVE SURG SYN 7.0 PF PI (GLOVE) ×4 IMPLANT
GOWN STRL REUS W/ TWL LRG LVL3 (GOWN DISPOSABLE) ×6 IMPLANT
GRASPER SUT TROCAR 14GX15 (MISCELLANEOUS) ×2 IMPLANT
IRRIGATOR SUCT 8 DISP DVNC XI (IRRIGATION / IRRIGATOR) IMPLANT
IV NS 1000ML BAXH (IV SOLUTION) IMPLANT
KIT PINK PAD W/HEAD ARE REST (MISCELLANEOUS) ×2 IMPLANT
KIT PINK PAD W/HEAD ARM REST (MISCELLANEOUS) ×2 IMPLANT
LABEL OR SOLS (LABEL) ×2 IMPLANT
MANIFOLD NEPTUNE II (INSTRUMENTS) ×2 IMPLANT
NDL HYPO 22X1.5 SAFETY MO (MISCELLANEOUS) ×2 IMPLANT
NDL INSUFFLATION 14GA 120MM (NEEDLE) ×2 IMPLANT
NEEDLE HYPO 22X1.5 SAFETY MO (MISCELLANEOUS) ×2 IMPLANT
NEEDLE INSUFFLATION 14GA 120MM (NEEDLE) ×2 IMPLANT
NS IRRIG 500ML POUR BTL (IV SOLUTION) ×2 IMPLANT
OBTURATOR OPTICALSTD 8 DVNC (TROCAR) ×2 IMPLANT
PACK LAP CHOLECYSTECTOMY (MISCELLANEOUS) ×2 IMPLANT
SEAL UNIV 5-12 XI (MISCELLANEOUS) ×8 IMPLANT
SET TUBE SMOKE EVAC HIGH FLOW (TUBING) ×2 IMPLANT
SOLUTION ELECTROSURG ANTI STCK (MISCELLANEOUS) ×2 IMPLANT
SPIKE FLUID TRANSFER (MISCELLANEOUS) ×4 IMPLANT
SPONGE T-LAP 4X18 ~~LOC~~+RFID (SPONGE) IMPLANT
SUT VICRYL 0 UR6 27IN ABS (SUTURE) ×2 IMPLANT
SUTURE MNCRL 4-0 27XMF (SUTURE) ×2 IMPLANT
SYSTEM BAG RETRIEVAL 10MM (BASKET) ×2 IMPLANT
WATER STERILE IRR 500ML POUR (IV SOLUTION) ×2 IMPLANT

## 2023-09-02 NOTE — Anesthesia Postprocedure Evaluation (Signed)
 Anesthesia Post Note  Patient: Ronald Mueller  Procedure(s) Performed: CHOLECYSTECTOMY, ROBOT-ASSISTED, LAPAROSCOPIC INDOCYANINE GREEN FLUORESCENCE IMAGING (ICG)  Patient location during evaluation: PACU Anesthesia Type: General Level of consciousness: awake and alert, oriented and patient cooperative Pain management: pain level controlled Vital Signs Assessment: post-procedure vital signs reviewed and stable Respiratory status: spontaneous breathing, nonlabored ventilation and respiratory function stable Cardiovascular status: blood pressure returned to baseline and stable Postop Assessment: adequate PO intake Anesthetic complications: no   No notable events documented.   Last Vitals:  Vitals:   09/02/23 0943 09/02/23 0945  BP: 104/74 104/74  Pulse: 83 81  Resp: (!) 24 19  Temp:    SpO2: 94% 96%    Last Pain:  Vitals:   09/02/23 1002  TempSrc:   PainSc: 5                  Achille Xiang

## 2023-09-02 NOTE — H&P (Signed)
 No changes to below H and P, proceed with robotic assisted cholecystectomy with ICG.   CC: Biliary Colic History of Present Illness Ronald Mueller is a 33 y.o. male with past medical history significant for neurofibromatosis who presents to consultation for biliary colic.  The patient reports that over the last several months he has developed epigastric pain.  He reports that this is usually after he eats.  He says that this is sometimes associated with some nausea but he has not ever had any episodes of vomiting.  He reports that last week the pain progressed so much that he went to the emergency department for evaluation.  There he was found to have a distended gallbladder with a 1.6 cm stone at the neck of the gallbladder.  He says that his pain abated and he was discharged.  He denies any pain episodes since and denies any fevers or chills.  He is tolerating a diet and having normal bowel function.  He has a history of inguinal hernia repair done robotically.   Past Medical History     Past Medical History:  Diagnosis Date   H/O neurofibromatosis [facility])                   Past Surgical History:  Procedure Laterality Date   HERNIA REPAIR              Allergies  No Known Allergies     No current outpatient medications on file.      No current facility-administered medications for this visit.        Family History History reviewed. No pertinent family history.          Social History Social History  Social History         Tobacco Use   Smoking status: Every Day      Types: Cigarettes      Passive exposure: Never   Smokeless tobacco: Never  Vaping Use   Vaping status: Every Day   Substances: Nicotine, Flavoring  Substance Use Topics   Alcohol use: Yes      Alcohol/week: 1.0 standard drink of alcohol      Types: 1 Cans of beer per week      Comment: weekly   Drug use: No            ROS Full ROS of systems performed and is otherwise negative there than  what is stated in the HPI   Physical Exam Blood pressure 117/76, pulse 73, temperature 98.4 F (36.9 C), temperature source Oral, height 5\' 3"  (1.6 m), weight 160 lb 12.8 oz (72.9 kg), SpO2 98%.   Alert and oriented x 3, normal work of breathing on room air, regular rate and rhythm, abdomen is soft, nontender and nondistended, robotic port sites have healed well.  No rebound tenderness or Murphy sign.   Data Reviewed Labs reviewed and significant for a normal T bilirubin.  I personally reviewed his ultrasound and it looks like there is a large stone within the gallbladder lumen.  There is no pericholecystic fluid or thickening gallbladder wall. I have personally reviewed the patient's imaging and medical records.     Assessment/plan Assessment 33 year old with history of neurofibromatosis who presents in consultation for biliary colic.  I discussed surgical intervention and the risks, benefits alternatives of the procedure including risk of infection, bleeding, injury to the common bile duct, retained stone, bile leak and need for open procedure he understands these risks and wishes to  proceed with surgery.  We will use ICG.         Barrett Lick 08/29/2023, 2:29 PM

## 2023-09-02 NOTE — Anesthesia Preprocedure Evaluation (Addendum)
 Anesthesia Evaluation  Patient identified by MRN, date of birth, ID band Patient awake    Reviewed: Allergy & Precautions, NPO status , Patient's Chart, lab work & pertinent test results  History of Anesthesia Complications Negative for: history of anesthetic complications  Airway Mallampati: III   Neck ROM: Full    Dental no notable dental hx.    Pulmonary  Current vaping   Pulmonary exam normal breath sounds clear to auscultation       Cardiovascular Exercise Tolerance: Good negative cardio ROS Normal cardiovascular exam Rhythm:Regular Rate:Normal     Neuro/Psych negative neurological ROS     GI/Hepatic negative GI ROS,,,  Endo/Other  negative endocrine ROS    Renal/GU negative Renal ROS     Musculoskeletal   Abdominal   Peds  Hematology Neurofibromatosis    Anesthesia Other Findings   Reproductive/Obstetrics                             Anesthesia Physical Anesthesia Plan  ASA: 2  Anesthesia Plan: General   Post-op Pain Management:    Induction: Intravenous  PONV Risk Score and Plan: 2 and Ondansetron , Dexamethasone  and Treatment may vary due to age or medical condition  Airway Management Planned: Oral ETT  Additional Equipment:   Intra-op Plan:   Post-operative Plan: Extubation in OR  Informed Consent: I have reviewed the patients History and Physical, chart, labs and discussed the procedure including the risks, benefits and alternatives for the proposed anesthesia with the patient or authorized representative who has indicated his/her understanding and acceptance.     Dental advisory given  Plan Discussed with: CRNA  Anesthesia Plan Comments: (Patient consented for risks of anesthesia including but not limited to:  - adverse reactions to medications - damage to eyes, teeth, lips or other oral mucosa - nerve damage due to positioning  - sore throat or  hoarseness - damage to heart, brain, nerves, lungs, other parts of body or loss of life  Informed patient about role of CRNA in peri- and intra-operative care.  Patient voiced understanding.)        Anesthesia Quick Evaluation

## 2023-09-02 NOTE — Telephone Encounter (Signed)
 Patient had robotic cholecystectomy done today by Dr. Cornel Diesel.  Patient states that he needs his Oxycodone  to be sent to the [facility] on [address] In [place].  The other did not have.  He did call the one on [address] and was told that they do have the Oxy in stock.  Please resend Rx to this location.   Thank you.

## 2023-09-02 NOTE — Anesthesia Procedure Notes (Signed)
 Procedure Name: Intubation Date/Time: 09/02/2023 7:35 AM  Performed by: Darlen Eglin, Hopie Pellegrin, CRNAPre-anesthesia Checklist: Patient identified, Emergency Drugs available, Suction available and Patient being monitored Patient Re-evaluated:Patient Re-evaluated prior to induction Oxygen Delivery Method: Circle system utilized Preoxygenation: Pre-oxygenation with 100% oxygen Induction Type: IV induction Ventilation: Two handed mask ventilation required Laryngoscope Size: McGrath and 4 Grade View: Grade I Tube type: Oral Number of attempts: 1 Airway Equipment and Method: Stylet Placement Confirmation: ETT inserted through vocal cords under direct vision, positive ETCO2 and breath sounds checked- equal and bilateral Secured at: 22 cm Tube secured with: Tape Dental Injury: Teeth and Oropharynx as per pre-operative assessment

## 2023-09-02 NOTE — Op Note (Signed)
 Robotic assisted laparoscopic Cholecystectomy  Pre-operative Diagnosis: Biliary Colic  Post-operative Diagnosis: Same  Procedure:  Robotic assisted laparoscopic Cholecystectomy  Surgeon: Severa Daniels, MD  Anesthesia: Gen. with endotracheal tube  Findings: Mildly inflamed gallbladder, cystic duct clipped double on stay side and once distally, artery taken with cautery  Estimated Blood Loss: 10cc       Specimens: Gallbladder           Complications: none   Procedure Details  The patient was seen again in the Holding Room. The benefits, complications, treatment options, and expected outcomes were discussed with the patient. The risks of bleeding, infection, recurrence of symptoms, failure to resolve symptoms, bile duct damage, bile duct leak, retained common bile duct stone, bowel injury, any of which could require further surgery and/or ERCP, stent, or papillotomy were reviewed with the patient. The likelihood of improving the patient's symptoms with return to their baseline status is good.  The patient and/or family concurred with the proposed plan, giving informed consent.  The patient was taken to Operating Room, identified  and the procedure verified as robotic Cholecystectomy.  A Time Out was held and the above information confirmed.  Prior to the induction of general anesthesia, antibiotic prophylaxis was administered. VTE prophylaxis was in place. General endotracheal anesthesia was then administered and tolerated well. After the induction, the abdomen was prepped with Chloraprep and draped in the sterile fashion. The patient was positioned in the supine position.  A veress needle was inserted into the abdomen using standard drop technique. An 8mm infra-umbilical robotic port was then placed under direct visualization. There was no injury noted at the site of veress needle insertion. Two right sided abdominal 8mm ports followed by an 8mm left abdominal robotic ports were placed  under direct visualization. The left sided abdominal port was then upsized to a 12mm robotic port.  The patient was positioned  in reverse Trendelenburg, robot was brought to the surgical field and docked in the standard fashion.  We made sure all the instrumentation was kept indirect view at all times and that there were no collision between the arms. I scrubbed out and went to the console.  The gallbladder was identified, the fundus grasped and retracted cephalad. Adhesions were lysed bluntly. The infundibulum was grasped and retracted laterally, exposing the peritoneum overlying the triangle of Calot. This was then divided and exposed in a blunt fashion. An extended critical view of the cystic duct and cystic artery was obtained.  The cystic duct was clearly identified and bluntly dissected.   Duct was double clipped and divided. The cystic artery was taken with cautery.  Using ICG cholangiography we visualized the cystic duct. The gallbladder was taken from the gallbladder fossa in a retrograde fashion with the electrocautery.  Hemostasis was achieved with the electrocautery. nspection of the right upper quadrant was performed. No bleeding, bile duct injury or leak, or bowel injury was noted. Robotic instruments and robotic arms were undocked in the standard fashion.  I scrubbed back in.  The gallbladder was removed and placed in an Endocatch bag.   The left lower quadrant fascia was then closed with a 0 vicryl using a suture needle passer. The pre-peritoneal space was then infiltrated with liposomal bupivicaine and marcaine  solution. Pneumoperitoneum was released.  4-0 subcuticular Monocryl was used to close the skin. Dermabond was  applied.  The patient was then extubated and brought to the recovery room in stable condition. Sponge, lap, and needle counts were correct at closure and  at the conclusion of the case.               Severa Daniels, M.D. [facility]

## 2023-09-02 NOTE — Transfer of Care (Signed)
 Immediate Anesthesia Transfer of Care Note  Patient: Ronald Mueller  Procedure(s) Performed: CHOLECYSTECTOMY, ROBOT-ASSISTED, LAPAROSCOPIC INDOCYANINE GREEN FLUORESCENCE IMAGING (ICG)  Patient Location: PACU  Anesthesia Type:General  Level of Consciousness: drowsy  Airway & Oxygen Therapy: Patient Spontanous Breathing and Patient connected to face mask oxygen  Post-op Assessment: Report given to RN and Post -op Vital signs reviewed and stable  Post vital signs: Reviewed and stable  Last Vitals:  Vitals Value Taken Time  BP 124/57 09/02/23 0901  Temp    Pulse 77 09/02/23 0904  Resp 21 09/02/23 0904  SpO2 96 % 09/02/23 0904  Vitals shown include unfiled device data.  Last Pain:  Vitals:   09/02/23 0616  TempSrc: Temporal  PainSc: 0-No pain         Complications: No notable events documented.

## 2023-09-03 ENCOUNTER — Other Ambulatory Visit: Payer: Self-pay | Admitting: General Surgery

## 2023-09-03 ENCOUNTER — Encounter: Payer: Self-pay | Admitting: General Surgery

## 2023-09-03 LAB — SURGICAL PATHOLOGY

## 2023-09-03 MED ORDER — OXYCODONE HCL 5 MG PO TABS: 5.0000 mg | ORAL_TABLET | Freq: Three times a day (TID) | ORAL | 0 refills | Status: DC | PRN

## 2023-09-13 ENCOUNTER — Telehealth: Payer: Self-pay | Admitting: *Deleted

## 2023-09-13 ENCOUNTER — Other Ambulatory Visit
Admission: RE | Admit: 2023-09-13 | Discharge: 2023-09-13 | Disposition: A | Discharge status: Home / Self Care | Source: Ambulatory Visit | Attending: Internal Medicine | Admitting: Internal Medicine

## 2023-09-13 DIAGNOSIS — R0602 Shortness of breath: Secondary | ICD-10-CM | POA: Diagnosis present

## 2023-09-13 DIAGNOSIS — R109 Unspecified abdominal pain: Secondary | ICD-10-CM | POA: Insufficient documentation

## 2023-09-13 LAB — D-DIMER, QUANTITATIVE: D-Dimer, Quant: <0.27 ug{FEU}/mL (ref 0.00–0.50)

## 2023-09-13 NOTE — Telephone Encounter (Signed)
Faxed FMLA to [employer] at [phone] 

## 2023-09-13 NOTE — Progress Notes (Signed)
 Subjective:    Chief Complaint  Patient presents with  . Abdominal Pain    X 1 day Pt 11 day post op cholecystectomy Follow up with PC is 7/2 Pt started having L sided abd pain yesterday after work Denies injury, heavy lifting or pulling No redness or bruising noted on abdomen Incision are clean and intact No redness or swelling noted around incision Slight tenderness with palpation on L side of abdomen near top incision Denies N/V/D Intermittent sharp and stabbing pain; worsening with movement Intermittent SOB with pain   . Pain    Denies chest pain Stabbing pain last night when having BM; per pt was straining Took px oxy last night; provided no relief    History was provided by the patient. Ronald Mueller is a 33 y.o., male  who presents with intermittent episodes of intense left upper quadrant pain, sometimes involving the entire left abdomen.  Has shortness of breath associated with the pain.  Patient is 11 days postop status post laparoscopic cholecystectomy.  Had been recovering well.  Reports no recent injury or unusual activity.  Pain seems worse with certain activities, movements, and positions.  No complaints of headache or dizziness; no sore throat or cough; no chest pain; no nausea, vomiting, diarrhea; no fevers or chills.  No attempt to treat prior to arrival. Symptoms include: above   Patient denies: above Treatment to date: above   Past Medical History:  Diagnosis Date  . Hemorrhoids   . IBS (irritable bowel syndrome)   . Inguinal hernia   . Neurofibromatosis (CMS/HHS-HCC)    The following portions of the patient's history were reviewed and updated as appropriate: allergies, current medications, past social history, and problem list.  Review of Systems A complete review of systems was performed.  Positive and pertinent negative responses are documented in the HPI, and all other systems are negative.   Objective:     Vitals:   09/13/23 0849  BP: 110/63   Pulse: 69  Temp: 36.7 C (98 F)  TempSrc: Oral  SpO2: 95%  Weight: 73.6 kg (162 lb 3.2 oz)  Height: 160 cm (5' 3)  PainSc: 0-No pain  PainLoc: Abdomen    General Appearance:  Well-developed, well-nourished.  Pleasant and cooperative.  No acute distress. HEENT:  [place]/AT, PERRLA, EOMI, sclera clear, conjunctivae pink.   Neck:  Supple, no JVD or adenopathy. Cardiovascular:  Regular rate and rhythm. No murmurs, rubs, or gallops. Lungs:  Clear to auscultation. No wheezes, rales, rhonchi. Chest: No deformity.  Nontender. Abdomen:  Soft, nontender, nondistended. Normoactive bowel sounds.  Back: No CVA tenderness. Extremities:  Full range of motion, no joint deformities.  No cyanosis, clubbing, or edema. Neuro:  Alert and orient x4; nonfocal.    Lab/X-ray/Treatments:   3 view abdomen-moderate to large stool burden; no free air or obstruction appreciated.  CMP-essentially within normal limits Amylase/lipase-pending CBC-5.1K, 59% neutrophils, 21% lymphocytes; 1.8% immature's. UA-clear  D-dimer-pending       Assessment:      1. Left sided abdominal pain of unknown cause -     X-ray abdomen 3 way series includes 1 view chest; Future -     Comprehensive Metabolic Panel (CMP) -     Amylase -     Lipase -     CBC w/auto Differential (5 Part) -     Urinalysis w/Microscopic -     D-dimer ([facility])  2. Musculoskeletal chest pain -     meloxicam (MOBIC) 15 MG tablet; Take  1 tablet (15 mg total) by mouth once daily as needed for Pain (Take with food.) for up to 14 doses  Dispense: 14 tablet; Refill: 0  3. Shortness of breath -     X-ray abdomen 3 way series includes 1 view chest; Future -     D-dimer [facility])   Left lower rib/left upper quadrant discomfort, fleeting, worse with certain movements and positions.  11 days postop from laparoscopic cholecystectomy.  Had been doing well otherwise.  Meloxicam once daily for potential musculoskeletal pain.  Also recommend MiraLAX daily for  constipation.  Limit oxycodone  so as not to contribute.  Amylase, lipase, D-dimer-pending.  Feel these will likely be within normal limits.  Will notify patient results when available.  Work note provided.  Call, turn, ED if problems.  Addendum-D-dimer-negative.  Staff to notify.  Continue same.  I spent a total of 60 minutes in both face-to-face and non-face-to-face activities, excluding procedures performed, for this visit on the date of this encounter 09/13/2023      Plan:   Requested Prescriptions   Signed Prescriptions Disp Refills  . meloxicam (MOBIC) 15 MG tablet 14 tablet 0    Sig: Take 1 tablet (15 mg total) by mouth once daily as needed for Pain (Take with food.) for up to 14 doses

## 2023-09-25 ENCOUNTER — Encounter: Payer: Self-pay | Admitting: Physician Assistant

## 2023-09-25 ENCOUNTER — Ambulatory Visit: Admitting: Physician Assistant

## 2023-09-25 VITALS — BP 103/65 | HR 69 | Ht 63.0 in | Wt 161.0 lb

## 2023-09-25 DIAGNOSIS — K8050 Calculus of bile duct without cholangitis or cholecystitis without obstruction: Secondary | ICD-10-CM

## 2023-09-25 DIAGNOSIS — Z09 Encounter for follow-up examination after completed treatment for conditions other than malignant neoplasm: Secondary | ICD-10-CM

## 2023-09-25 NOTE — Progress Notes (Signed)
 [facility] POST-OP OFFICE VISIT  09/25/2023  HPI: Ronald Mueller is a 33 y.o. male 23 days s/p robotic assisted laparoscopic cholecystectomy for biliary colic with Dr Marinda   He is doing reasonably well Has had some intermittent, random, pains throughout his abdomen but reports these are not too severe He did have labs on 06/20 which I did review and are markedly reassuring.  No fever, chills, emesis Tolerating PO; no diarrhea Incisions are well healed   Vital signs: BP 103/65   Pulse 69   Ht 5' 3 (1.6 m)   Wt 161 lb (73 kg)   SpO2 96%   BMI 28.52 kg/m    Physical Exam: Constitutional: Well appearing male, NAD Abdomen: Soft, non-tender, non-distended, no rebound/guarding Skin: Laparoscopic incisions are healing well, no erythema or drainage   Assessment/Plan: This is a 33 y.o. male 23 days s/p robotic assisted laparoscopic cholecystectomy for biliary colic with Dr Marinda    - Pain control prn  - Reviewed wound care recommendation  - Reviewed lifting restrictions; 4 weeks total  - Reviewed surgical pathology; Chronic and subacute cholecystitis, cholesterosis, cholelithiasis   - He can follow up on as needed basis; He understands to call with questions/concerns  -- Arthea Platt, PA-C [facility] 09/25/2023, 1:46 PM M-F: 7am - 4pm

## 2023-09-25 NOTE — Patient Instructions (Signed)
 GENERAL POST-OPERATIVE PATIENT INSTRUCTIONS   WOUND CARE INSTRUCTIONS:  Try to keep the wound dry and avoid ointments on the wound unless directed to do so.  If the wound becomes bright red and painful or starts to drain infected material that is not clear, please contact your physician immediately.  If the wound is mildly pink and has a thick firm ridge underneath it, this is normal, and is referred to as a healing ridge.  This will resolve over the next 4-6 weeks.  BATHING: You may shower if you have been informed of this by your surgeon. However, Please do not submerge in a tub, hot tub, or pool until incisions are completely sealed or have been told by your surgeon that you may do so.  DIET:  You may eat any foods that you can tolerate.  It is a good idea to eat a high fiber diet and take in plenty of fluids to prevent constipation.  If you do become constipated you may want to take a mild laxative or take ducolax tablets on a daily basis until your bowel habits are regular.  Constipation can be very uncomfortable, along with straining, after recent surgery.  ACTIVITY:  You are encouraged to walk and engage in light activity for the next two weeks.  You should not lift more than 20 pounds for 6 weeks total after surgery as it could put you at increased risk for complications.  Twenty pounds is roughly equivalent to a plastic bag of groceries. At that time- Listen to your body when lifting, if you have pain when lifting, stop and then try again in a few days. Soreness after doing exercises or activities of daily living is normal as you get back in to your normal routine.  MEDICATIONS:  Try to take narcotic medications and anti-inflammatory medications, such as tylenol, ibuprofen, naprosyn, etc., with food.  This will minimize stomach upset from the medication.  Should you develop nausea and vomiting from the pain medication, or develop a rash, please discontinue the medication and contact your  physician.  You should not drive, make important decisions, or operate machinery when taking narcotic pain medication.  SUNBLOCK Use sun block to incision area over the next year if this area will be exposed to sun. This helps decrease scarring and will allow you avoid a permanent darkened area over your incision.  QUESTIONS:  Please feel free to call our office if you have any questions, and we will be glad to assist you. [phone]

## 2024-04-20 ENCOUNTER — Emergency Department: Admission: EM | Admit: 2024-04-20 | Discharge: 2024-04-20 | Disposition: A | Discharge status: Home / Self Care

## 2024-04-20 ENCOUNTER — Other Ambulatory Visit: Payer: Self-pay

## 2024-04-20 ENCOUNTER — Encounter: Payer: Self-pay | Admitting: Emergency Medicine

## 2024-04-20 DIAGNOSIS — W260XXA Contact with knife, initial encounter: Secondary | ICD-10-CM | POA: Diagnosis not present

## 2024-04-20 DIAGNOSIS — Z23 Encounter for immunization: Secondary | ICD-10-CM | POA: Insufficient documentation

## 2024-04-20 DIAGNOSIS — S61412A Laceration without foreign body of left hand, initial encounter: Secondary | ICD-10-CM | POA: Insufficient documentation

## 2024-04-20 MED ORDER — LIDOCAINE-EPINEPHRINE-TETRACAINE (LET) TOPICAL GEL
3.0000 mL | Freq: Once | TOPICAL | Status: AC
  Administered 2024-04-20: 3 mL via TOPICAL
  Filled 2024-04-20: qty 3

## 2024-04-20 MED ORDER — TETANUS-DIPHTH-ACELL PERTUSSIS 5-2-15.5 LF-MCG/0.5 IM SUSP
0.5000 mL | Freq: Once | INTRAMUSCULAR | Status: AC
  Administered 2024-04-20: 0.5 mL via INTRAMUSCULAR
  Filled 2024-04-20: qty 0.5

## 2024-04-20 NOTE — ED Notes (Signed)
 Approx 1 lac to proximal palm of L hand. Cleansed with NS and wet dressing placed over lac.

## 2024-04-20 NOTE — Discharge Instructions (Signed)
You have been seen in the Emergency Department (ED) today for a laceration (cut).  Please keep the cut clean but do not submerge it in the water.  It has been repaired with staples or sutures that will need to be removed in about 7-10 days. Please follow up with your doctor, an urgent care, or return to the ED for suture removal.    Please take Tylenol (acetaminophen) or Motrin (ibuprofen) as needed for discomfort as written on the box.   Please follow up with your doctor as soon as possible regarding today's emergent visit.   Return to the ED or call your doctor if you notice any signs of infection such as fever, increased pain, increased redness, pus, or other symptoms that concern you.

## 2024-04-20 NOTE — ED Provider Notes (Signed)
 "  [facility] Provider Note    Event Date/Time   First MD Initiated Contact with Patient 04/20/24 2009     (approximate)   History   Laceration   HPI  Ronald Mueller is a 34 y.o. male  with a past medical history of neurofibromatosis presents to the emergency department with suspected laceration to the left palm via knife/razor blade that occurred around 3:45 PM today.  Patient states he was trying to cut a zip tie when the laceration occurred.  He denies any other injuries.  He denies fever, chills, puslike drainage, numbness or tingling of his hand.  He is unsure of his last tetanus vaccine.  Does not take any blood thinners.  Physical Exam   Triage Vital Signs: ED Triage Vitals  Encounter Vitals Group     BP 04/20/24 1800 (!) 141/77     Girls Systolic BP Percentile --      Girls Diastolic BP Percentile --      Boys Systolic BP Percentile --      Boys Diastolic BP Percentile --      Pulse Rate 04/20/24 1800 91     Resp 04/20/24 1800 16     Temp 04/20/24 1800 98.9 F (37.2 C)     Temp Source 04/20/24 1800 Oral     SpO2 04/20/24 1800 96 %     Weight 04/20/24 1801 160 lb (72.6 kg)     Height 04/20/24 1801 5' 2 (1.575 m)     Head Circumference --      Peak Flow --      Pain Score 04/20/24 1800 6     Pain Loc --      Pain Education --      Exclude from Growth Chart --     Most recent vital signs: Vitals:   04/20/24 1800  BP: (!) 141/77  Pulse: 91  Resp: 16  Temp: 98.9 F (37.2 C)  SpO2: 96%   General: Awake, in no acute distress.  Head: Normocephalic, atraumatic. CV: Good peripheral perfusion. Radial pulses 2+ b/l. Cap refill <2 sec. Respiratory:Normal respiratory effort.  No respiratory distress.  GI: Soft, non-distended. MSK: Normal ROM and 5/5 strength in all b/l finger joints. Skin:Warm, dry, 3 cm laceration to anterior left palm, bleeding controlled. Neurological: A&Ox4 to person, place, time, and situation. Sensation intact and  equal to b/l fingers. Strength symmetric.    ED Results / Procedures / Treatments   Labs (all labs ordered are listed, but only abnormal results are displayed) Labs Reviewed - No data to display   EKG     RADIOLOGY    PROCEDURES:  Critical Care performed: No   .Laceration Repair  Date/Time: 04/20/2024 9:33 PM  Performed by: Sheron Salm, PA-C Authorized by: Sheron Salm, PA-C   Consent:    Consent obtained:  Verbal   Consent given by:  Patient   Risks, benefits, and alternatives were discussed: yes     Risks discussed:  Infection, need for additional repair, nerve damage, poor wound healing, poor cosmetic result, pain, tendon damage and vascular damage Universal protocol:    Procedure explained and questions answered to patient or proxy's satisfaction: yes     Immediately prior to procedure, a time out was called: yes     Patient identity confirmed:  Verbally with patient Anesthesia:    Anesthesia method:  Topical application   Topical anesthetic:  LET Laceration details:    Location:  Hand   Hand  location:  L palm   Length (cm):  3 Pre-procedure details:    Preparation:  Patient was prepped and draped in usual sterile fashion Exploration:    Hemostasis achieved with:  LET   Wound exploration: wound explored through full range of motion and entire depth of wound visualized     Wound extent: no foreign body, no signs of injury, no nerve damage, no tendon damage, no underlying fracture and no vascular damage     Contaminated: no   Treatment:    Area cleansed with:  Povidone-iodine   Amount of cleaning:  Standard   Irrigation solution:  Sterile saline   Irrigation volume:  50 mL   Irrigation method:  Pressure wash Skin repair:    Repair method:  Sutures   Suture size:  4-0   Suture material:  Nylon   Suture technique:  Simple interrupted   Number of sutures:  3 Approximation:    Approximation:  Close Repair type:    Repair type:   Simple Post-procedure details:    Dressing:  Sterile dressing   Procedure completion:  Tolerated well, no immediate complications    MEDICATIONS ORDERED IN ED: Medications  Tdap (ADACEL ) injection 0.5 mL (0.5 mLs Intramuscular Given 04/20/24 2037)  lidocaine -EPINEPHrine -tetracaine  (LET) topical gel (3 mLs Topical Given 04/20/24 2041)     IMPRESSION / MDM / ASSESSMENT AND PLAN / ED COURSE  I reviewed the triage vital signs and the nursing notes.                              Differential diagnosis includes, but is not limited to, hand laceration, abrasion, abscess, nerve or tendon injury  Patient's presentation is most consistent with acute, uncomplicated illness.  Patient is a 34 year old male presenting with signs and symptoms as described above.  He has a laceration to the left hand on the palmar side, bleeding controlled.  Tetanus was updated.  He is neurovascularly intact with good range of motion of both wrists. LET was applied to achieve anesthesia.  Please see procedure note for full details regarding repair.  Wound care instructions discussed with the patient.  He will need to follow-up in 7 to 10 days for suture removal either with his PCP, at urgent care or at the emergency department.  The patient may return to the emergency department for any new, worsening, or concerning symptoms. Patient was given the opportunity to ask questions; all questions were answered. Emergency department return precautions including signs of infection were discussed with the patient.  Patient is in agreement to the treatment plan.  Patient is stable for discharge.    FINAL CLINICAL IMPRESSION(S) / ED DIAGNOSES   Final diagnoses:  Laceration of left hand without foreign body, initial encounter     Rx / DC Orders   ED Discharge Orders     None        Note:  This document was prepared using Dragon voice recognition software and may include unintentional dictation errors.     Sheron Salm, PA-C 04/20/24 2135    Jacolyn Pae, MD 04/20/24 2227  "

## 2024-04-20 NOTE — ED Triage Notes (Signed)
 Pt c/o accidental laceration to left palm by razorblade, sustained around 1545 pm today. Wound is bandaged and bleeding controlled in triage. Unsure of last tetanus shot.
